# Patient Record
Sex: Male | Born: 1960
Health system: Southern US, Community
[De-identification: ages and names within clinical notes are randomized; demographics above are authoritative.]

## PROBLEM LIST (undated history)

## (undated) DIAGNOSIS — E7800 Pure hypercholesterolemia, unspecified: Secondary | ICD-10-CM

## (undated) HISTORY — DX: Pure hypercholesterolemia, unspecified: E78.00

---

## 2001-06-10 HISTORY — PX: BREAST LUMPECTOMY: SHX2

## 2001-07-14 ENCOUNTER — Encounter: Payer: Self-pay | Admitting: Family Medicine

## 2001-07-14 ENCOUNTER — Ambulatory Visit (HOSPITAL_COMMUNITY): Admission: RE | Admit: 2001-07-14 | Discharge: 2001-07-14 | Payer: Self-pay | Admitting: Family Medicine

## 2005-08-15 ENCOUNTER — Encounter: Admission: RE | Admit: 2005-08-15 | Discharge: 2005-08-15 | Payer: Self-pay | Admitting: Occupational Medicine

## 2009-05-17 ENCOUNTER — Encounter: Payer: Self-pay | Admitting: Internal Medicine

## 2009-05-22 ENCOUNTER — Ambulatory Visit: Payer: Self-pay | Admitting: Internal Medicine

## 2009-05-22 ENCOUNTER — Ambulatory Visit (HOSPITAL_COMMUNITY): Admission: RE | Admit: 2009-05-22 | Discharge: 2009-05-22 | Payer: Self-pay | Admitting: Internal Medicine

## 2011-03-27 ENCOUNTER — Ambulatory Visit (HOSPITAL_COMMUNITY)
Admission: RE | Admit: 2011-03-27 | Discharge: 2011-03-27 | Disposition: A | Discharge status: Home / Self Care | Payer: BC Managed Care – PPO | Source: Ambulatory Visit | Attending: Physician Assistant | Admitting: Physician Assistant

## 2011-03-27 ENCOUNTER — Other Ambulatory Visit (HOSPITAL_COMMUNITY): Payer: Self-pay | Admitting: Physician Assistant

## 2011-03-27 DIAGNOSIS — M542 Cervicalgia: Secondary | ICD-10-CM

## 2011-03-27 DIAGNOSIS — M5412 Radiculopathy, cervical region: Secondary | ICD-10-CM

## 2011-03-27 DIAGNOSIS — M25519 Pain in unspecified shoulder: Secondary | ICD-10-CM | POA: Insufficient documentation

## 2011-04-02 ENCOUNTER — Other Ambulatory Visit (HOSPITAL_COMMUNITY): Payer: Self-pay | Admitting: Physician Assistant

## 2011-04-02 DIAGNOSIS — M5412 Radiculopathy, cervical region: Secondary | ICD-10-CM

## 2011-04-02 DIAGNOSIS — M542 Cervicalgia: Secondary | ICD-10-CM

## 2011-04-04 ENCOUNTER — Other Ambulatory Visit (HOSPITAL_COMMUNITY): Payer: BC Managed Care – PPO

## 2011-04-08 ENCOUNTER — Ambulatory Visit (HOSPITAL_COMMUNITY)
Admission: RE | Admit: 2011-04-08 | Discharge: 2011-04-08 | Disposition: A | Discharge status: Home / Self Care | Payer: BC Managed Care – PPO | Source: Ambulatory Visit | Attending: Physician Assistant | Admitting: Physician Assistant

## 2011-04-08 ENCOUNTER — Other Ambulatory Visit (HOSPITAL_COMMUNITY): Payer: BC Managed Care – PPO

## 2011-04-08 DIAGNOSIS — M542 Cervicalgia: Secondary | ICD-10-CM

## 2011-04-08 DIAGNOSIS — M5412 Radiculopathy, cervical region: Secondary | ICD-10-CM | POA: Insufficient documentation

## 2011-04-08 DIAGNOSIS — M47812 Spondylosis without myelopathy or radiculopathy, cervical region: Secondary | ICD-10-CM | POA: Insufficient documentation

## 2011-04-08 DIAGNOSIS — R209 Unspecified disturbances of skin sensation: Secondary | ICD-10-CM | POA: Insufficient documentation

## 2012-05-01 ENCOUNTER — Other Ambulatory Visit (HOSPITAL_COMMUNITY): Payer: Self-pay | Admitting: Internal Medicine

## 2012-05-01 DIAGNOSIS — M5430 Sciatica, unspecified side: Secondary | ICD-10-CM

## 2012-05-01 DIAGNOSIS — M545 Low back pain, unspecified: Secondary | ICD-10-CM

## 2012-05-04 ENCOUNTER — Ambulatory Visit (HOSPITAL_COMMUNITY)
Admission: RE | Admit: 2012-05-04 | Discharge: 2012-05-04 | Disposition: A | Discharge status: Home / Self Care | Payer: BC Managed Care – PPO | Source: Ambulatory Visit | Attending: Internal Medicine | Admitting: Internal Medicine

## 2012-05-04 DIAGNOSIS — M545 Low back pain, unspecified: Secondary | ICD-10-CM | POA: Insufficient documentation

## 2012-05-04 DIAGNOSIS — M47817 Spondylosis without myelopathy or radiculopathy, lumbosacral region: Secondary | ICD-10-CM | POA: Insufficient documentation

## 2012-05-04 DIAGNOSIS — M5430 Sciatica, unspecified side: Secondary | ICD-10-CM | POA: Insufficient documentation

## 2013-03-03 ENCOUNTER — Other Ambulatory Visit (HOSPITAL_COMMUNITY): Payer: Self-pay | Admitting: Family Medicine

## 2013-03-03 DIAGNOSIS — Q618 Other cystic kidney diseases: Secondary | ICD-10-CM

## 2013-03-08 ENCOUNTER — Ambulatory Visit (HOSPITAL_COMMUNITY)
Admission: RE | Admit: 2013-03-08 | Discharge: 2013-03-08 | Disposition: A | Discharge status: Home / Self Care | Payer: BC Managed Care – PPO | Source: Ambulatory Visit | Attending: Family Medicine | Admitting: Family Medicine

## 2013-03-08 DIAGNOSIS — Q619 Cystic kidney disease, unspecified: Secondary | ICD-10-CM | POA: Insufficient documentation

## 2013-03-08 DIAGNOSIS — Q618 Other cystic kidney diseases: Secondary | ICD-10-CM

## 2014-09-07 ENCOUNTER — Ambulatory Visit (INDEPENDENT_AMBULATORY_CARE_PROVIDER_SITE_OTHER): Payer: BLUE CROSS/BLUE SHIELD | Admitting: Neurology

## 2014-09-07 ENCOUNTER — Encounter: Payer: Self-pay | Admitting: Neurology

## 2014-09-07 VITALS — BP 122/80 | HR 69 | Resp 16 | Ht 68.0 in | Wt 217.8 lb

## 2014-09-07 DIAGNOSIS — G4733 Obstructive sleep apnea (adult) (pediatric): Secondary | ICD-10-CM | POA: Insufficient documentation

## 2014-09-07 DIAGNOSIS — G4726 Circadian rhythm sleep disorder, shift work type: Secondary | ICD-10-CM | POA: Diagnosis not present

## 2014-09-07 DIAGNOSIS — Z9889 Other specified postprocedural states: Secondary | ICD-10-CM | POA: Diagnosis not present

## 2014-09-07 DIAGNOSIS — M2619 Other specified anomalies of jaw-cranial base relationship: Secondary | ICD-10-CM | POA: Diagnosis not present

## 2014-09-07 DIAGNOSIS — Z9989 Dependence on other enabling machines and devices: Secondary | ICD-10-CM | POA: Insufficient documentation

## 2014-09-07 NOTE — Progress Notes (Signed)
SLEEP MEDICINE CLINIC   Provider:  Melvyn Novasarmen  Erikson Danzy, M D  Referring Provider: Assunta FoundGolding, John, MD Primary Care Physician:  Colette RibasGOLDING, JOHN CABOT, MD  Chief Complaint  Patient presents with  . NP [facility] Sleep Consult/ using CPAP    Rm 10, alone    HPI:  Joshua Kirby is a [age] y.o. male , seen here as a referral  from Dr. Theodis ShoveJunell Koberlein,  Mr. [name], a 54 year old right-handed gentleman is seen here today for a sleep consultation. He is an established CPAP user and has [facility] [place] he had 2 sleep studies in the past 1 diagnostic  In 2002 , leading to an ENT procedure with tonsillectomy and UPPP, and this he;ped for less than one year, after that another PSG with CPAP titration in 2004 , initially with a FFM , later with a nasal pillow. He felt better rested and had more energy, it worked well for him.  He got another, newer machine in 2009.  Mr. [name] works for [employer] and does wear respiration equipment and respiration protective equipment. He did not have trouble tolerating a full face mask due to any claustrophobia, his problem were pressure marks of the pressure on the lower jaw. He is a compliant CPAP user and he brought his machine to today's visit. It is a Respimat Elite 2 machine with EPR settings and with her manual dye all for the humidifier. It looks to be in good shape. The machine is set at 13 cm water pressure his average daily usage time is 8 hours and 23 minutes and over the last 90 days he has missed only 1. His compliance is 96% for over 4 hours of use. He is highly compliant. His residual AHI is 3.8, which is fine. The air leak at the 95th percentile is 0.0 liters.  He has variable work hours and sometimes starts at 4:30 in the morning on other days 8 AM. 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 The variability of his work day he also  extends to the hours worked in total. On occasion he will work up to 14 hours straight. So he mainly varies between an early and late shift but usually not strictly night shift work. He feels refreshed and restored when he wakes up in the morning and at this time does not struggle with excessive daytime sleepiness. He does nap , sometimes without CPAP, and his wife noted snoring . Usually he naps for only 15-  45 minutes. He reports no dream activity during naps.  He drinks one cup of coffee in early AM  12 ounces, and during daytime up to 2  Caffeine containing  Sodas.   CPAP leaks water, The water reservoirs heating plate -seal is defect.  He uses a nasal pillow and would like it to be attached to a heated coil to avoid condensation water.    He needs new supplies, not a new machine yet.   Review of Systems: Out of a complete 14 system review, the patient complains of only the following symptoms, and all other reviewed systems are negative.   Epworth score  6 , Fatigue severity score 13  , depression score 1 .    History   Social History  . Marital Status: Married    Spouse Name: [name]  . Number of Children: 2  . Years of  Education: HS   Occupational History  .      [employer]   Social History Main Topics  . Smoking status: Never Smoker   . Smokeless tobacco: Not on file  . Alcohol Use: No  . Drug Use: No  . Sexual Activity: Not on file   Other Topics Concern  . Not on file   Social History Narrative   Caffeine 1 cup daily am, 2 drinks daily.    Family History  Problem Relation Age of Onset  . Diabetes Brother   . Kidney disease Mother     Past Medical History  Diagnosis Date  . High cholesterol     Past Surgical History  Procedure Laterality Date  . Breast lumpectomy Right 2003    Benign    Current Outpatient Prescriptions  Medication Sig Dispense Refill  . naproxen sodium (ANAPROX) 220 MG tablet Take 220 mg by mouth daily. May take additional one if  needed.    . simvastatin (ZOCOR) 20 MG tablet Take 20 mg by mouth daily.     No current facility-administered medications for this visit.    Allergies as of 09/07/2014  . (No Known Allergies)    Vitals: BP 122/80 mmHg  Pulse 69  Resp 16  Ht  (1.727 m)  Wt 217 lb 12.8 oz (98.793 kg)  BMI 33.12 kg/m2 Last Weight:  Wt Readings from Last 1 Encounters:  09/07/14 217 lb 12.8 oz (98.793 kg)       Last Height:   Ht Readings from Last 1 Encounters:  09/07/14  (1.727 m)    Physical exam:  General: The patient is awake, alert and appears not in acute distress. The patient is well groomed. Head: Normocephalic, atraumatic. Neck is supple. Mallampati 3 , status post UPPP.  neck circumference: 18 . Nasal airflow  Rhinitis is allergy related, seasonal., TMJ is not evident . Retrognathia is seen. The patient has a mustache.  Cardiovascular:  Regular rate and rhythm , without  murmurs or carotid bruit, and without distended neck veins. Respiratory: Lungs are clear to auscultation. Skin:  Without evidence of edema, or rash Trunk: BMI is  elevated and patient  has normal posture.  Neurologic exam : The patient is awake and alert, oriented to place and time.   Memory subjective described as intact. There is a normal attention span & concentration ability.  Speech is fluent without  dysarthria, dysphonia or aphasia. Mood and affect are appropriate.  Cranial nerves: Pupils are equal and briskly reactive to light. Funduscopic exam without  evidence of pallor or edema.  Extraocular movements  in vertical and horizontal planes intact and without nystagmus. Visual fields by finger perimetry are intact. Hearing to finger rub intact.  Facial sensation intact to fine touch. Facial motor strength is symmetric and tongue and uvula move midline.  Motor exam:  Tone ,muscle bulk and strength are symmetric  in all extremities.  Sensory:  Fine touch, pinprick and vibration were tested in all  extremities.  He has numbness in the left index finger, cervical 6.  Proprioception is  normal.  Coordination: Rapid alternating movements in the fingers/hands is normal.  Finger-to-nose maneuver  normal without evidence of ataxia, dysmetria or tremor.  Gait and station: Patient walks without assistive device and is able unassisted to climb up to the exam table. Strength within normal limits. Stance is stable and normal. Tandem gait is unfragmented. Romberg testing is   negative.  Deep tendon reflexes: in the  upper and lower extremities are symmetric and intact. Babinski maneuver response is  downgoing.   Assessment:  After physical and neurologic examination, review of laboratory studies, imaging, neurophysiology testing and pre-existing records, assessment is  1) established CPAP user, original sleep study not available. He is feeling and doing well on CPAP currently set at 13 cm without EPR. Needs supplies, but not a new study.  50% face to face time was used in discussion of new treatment options, the risk factors arising form untreated OSA and the new  Interfaces , higher comfort in CPAP use.    The patient was advised of the nature of the diagnosed sleep disorder , the treatment options and risks for general a health and wellness arising from not treating the condition. Visit duration was 30 minutes.   Plan:  Treatment plan and additional workup :  The patient requires a new humidifier chamber for his ResMed Elite 2 machine, he needs a new chin strap, I would like for him to try a [employer] Respironics dream where mask of which I showed him a picture. I will ask [facility] in [place] to see if the patient could be fitted. A heated coil would be beneficial.      Melvyn Novas MD  09/07/2014

## 2014-09-30 ENCOUNTER — Encounter: Payer: Self-pay | Admitting: Neurology

## 2014-11-11 ENCOUNTER — Emergency Department (HOSPITAL_COMMUNITY)
Admission: EM | Admit: 2014-11-11 | Discharge: 2014-11-11 | Disposition: A | Discharge status: Home / Self Care | Payer: BLUE CROSS/BLUE SHIELD | Attending: Emergency Medicine | Admitting: Emergency Medicine

## 2014-11-11 ENCOUNTER — Emergency Department (HOSPITAL_COMMUNITY): Payer: BLUE CROSS/BLUE SHIELD

## 2014-11-11 ENCOUNTER — Encounter (HOSPITAL_COMMUNITY): Payer: Self-pay | Admitting: Emergency Medicine

## 2014-11-11 DIAGNOSIS — Z79899 Other long term (current) drug therapy: Secondary | ICD-10-CM | POA: Diagnosis not present

## 2014-11-11 DIAGNOSIS — Z8669 Personal history of other diseases of the nervous system and sense organs: Secondary | ICD-10-CM | POA: Insufficient documentation

## 2014-11-11 DIAGNOSIS — M542 Cervicalgia: Secondary | ICD-10-CM | POA: Diagnosis not present

## 2014-11-11 DIAGNOSIS — R079 Chest pain, unspecified: Secondary | ICD-10-CM

## 2014-11-11 DIAGNOSIS — E780 Pure hypercholesterolemia: Secondary | ICD-10-CM | POA: Insufficient documentation

## 2014-11-11 DIAGNOSIS — Z791 Long term (current) use of non-steroidal anti-inflammatories (NSAID): Secondary | ICD-10-CM | POA: Insufficient documentation

## 2014-11-11 LAB — CBC WITH DIFFERENTIAL/PLATELET
Basophils Absolute: 0 10*3/uL (ref 0.0–0.1)
Basophils Relative: 1 % (ref 0–1)
Eosinophils Absolute: 0.2 10*3/uL (ref 0.0–0.7)
Eosinophils Relative: 2 % (ref 0–5)
HCT: 44 % (ref 39.0–52.0)
HEMOGLOBIN: 14.7 g/dL (ref 13.0–17.0)
LYMPHS ABS: 2.2 10*3/uL (ref 0.7–4.0)
Lymphocytes Relative: 26 % (ref 12–46)
MCH: 30.2 pg (ref 26.0–34.0)
MCHC: 33.4 g/dL (ref 30.0–36.0)
MCV: 90.3 fL (ref 78.0–100.0)
MONOS PCT: 9 % (ref 3–12)
Monocytes Absolute: 0.8 10*3/uL (ref 0.1–1.0)
Neutro Abs: 5.3 10*3/uL (ref 1.7–7.7)
Neutrophils Relative %: 62 % (ref 43–77)
Platelets: 190 10*3/uL (ref 150–400)
RBC: 4.87 MIL/uL (ref 4.22–5.81)
RDW: 12.5 % (ref 11.5–15.5)
WBC: 8.5 10*3/uL (ref 4.0–10.5)

## 2014-11-11 LAB — BASIC METABOLIC PANEL
Anion gap: 9 (ref 5–15)
BUN: 12 mg/dL (ref 6–20)
CALCIUM: 8.9 mg/dL (ref 8.9–10.3)
CO2: 27 mmol/L (ref 22–32)
CREATININE: 0.97 mg/dL (ref 0.61–1.24)
Chloride: 105 mmol/L (ref 101–111)
GFR calc Af Amer: >60 mL/min (ref >60)
GFR calc non Af Amer: >60 mL/min (ref >60)
GLUCOSE: 142 mg/dL — AB (ref 65–99)
POTASSIUM: 3.4 mmol/L — AB (ref 3.5–5.1)
SODIUM: 141 mmol/L (ref 135–145)

## 2014-11-11 LAB — TROPONIN I: Troponin I: <0.03 ng/mL (ref <0.031)

## 2014-11-11 MED ORDER — NAPROXEN 375 MG PO TABS: 375.0000 mg | ORAL_TABLET | Freq: Two times a day (BID) | ORAL | Status: DC

## 2014-11-11 MED ORDER — ORPHENADRINE CITRATE ER 100 MG PO TB12
100.0000 mg | ORAL_TABLET | Freq: Two times a day (BID) | ORAL | Status: DC | PRN
Start: 2014-11-11 — End: 2017-07-15

## 2014-11-11 MED ORDER — HYDROCODONE-ACETAMINOPHEN 5-325 MG PO TABS
1.0000 | ORAL_TABLET | ORAL | Status: DC | PRN
Start: 2014-11-11 — End: 2017-07-15

## 2014-11-11 MED ORDER — HYDROMORPHONE HCL 1 MG/ML IJ SOLN
1.0000 mg | Freq: Once | INTRAMUSCULAR | Status: AC
  Administered 2014-11-11: 1 mg via INTRAVENOUS
  Filled 2014-11-11: qty 1

## 2014-11-11 NOTE — ED Notes (Signed)
Patient reports right-sided chest pain that started two weeks ago, worsening today. Reports radiation to right shoulder, right neck, and right arm. Also reports numbness and tingling to right ring and pinky finger and to right thigh.

## 2014-11-11 NOTE — ED Notes (Signed)
Patient given discharge instruction, verbalized understand. IV removed, band aid applied. Patient ambulatory out of the department.  

## 2014-11-11 NOTE — Discharge Instructions (Signed)
Follow-up closely with her primary doctor to discuss MRI of your cervical region. Take your muscle relaxant and over-the-counter pain meds as needed. For severe pain take norco or vicodin however realize they have the potential for addiction and it can make you sleepy and has tylenol in it.  No operating machinery while taking. If you were given medicines take as directed.  If you are on coumadin or contraceptives realize their levels and effectiveness is altered by many different medicines.  If you have any reaction (rash, tongues swelling, other) to the medicines stop taking and see a physician.    If your blood pressure was elevated in the ER make sure you follow up for management with a primary doctor or return for chest pain, shortness of breath or stroke symptoms.  Please follow up as directed and return to the ER or see a physician for new or worsening symptoms.  Thank you. Filed Vitals:   11/11/14 2009 11/11/14 2030 11/11/14 2100 11/11/14 2130  BP: 152/95 147/96 148/94 141/85  Pulse: 72 77 67 70  Temp:      TempSrc:      Resp: 22 21 20 23   Height:      Weight:      SpO2: 96% 93% 94% 95%

## 2014-11-11 NOTE — ED Provider Notes (Signed)
CSN: [id number]     Arrival date & time 11/11/14  1912 History   First MD Initiated Contact with Patient 11/11/14 1921     Chief Complaint  Patient presents with  . Chest Pain     (Consider location/radiation/quality/duration/timing/severity/associated sxs/prior Treatment) HPI Comments: 54 year old male with history of sleep apnea, cholesterol, nonsmoker presents with right-sided chest pain and neck pain with mild radiation down right arm. Patient has had this constant for 3 weeks worse with movement in specific positions of his neck. No recent injuries. No recent MRI of the cervical spine. No weakness, mild intermittent numbness to ring finger and small finger on the right. No bowel or bladder incontinence. No cardiac history. No exertional symptoms only worse with movement. Patient saw primary doctor and was given sterilely it's with no improvement. No blood clot history, no recent surgery. No leg swelling no leg weakness.  Patient is a 54 y.o. male presenting with chest pain. The history is provided by the patient.  Chest Pain Associated symptoms: no abdominal pain, no back pain, no fever, no headache, no shortness of breath and not vomiting     Past Medical History  Diagnosis Date  . High cholesterol    Past Surgical History  Procedure Laterality Date  . Breast lumpectomy Right 2003    Benign   Family History  Problem Relation Age of Onset  . Diabetes Brother   . Kidney disease Mother    History  Substance Use Topics  . Smoking status: Never Smoker   . Smokeless tobacco: Not on file  . Alcohol Use: No    Review of Systems  Constitutional: Negative for fever and chills.  HENT: Negative for congestion.   Eyes: Negative for visual disturbance.  Respiratory: Negative for shortness of breath.   Cardiovascular: Positive for chest pain.  Gastrointestinal: Negative for vomiting and abdominal pain.  Genitourinary: Negative for dysuria and flank pain.  Musculoskeletal: Positive  for neck pain. Negative for back pain and neck stiffness.  Skin: Negative for rash.  Neurological: Negative for light-headedness and headaches.      Allergies  Review of patient's allergies indicates no known allergies.  Home Medications   Prior to Admission medications   Medication Sig Start Date End Date Taking? Authorizing Provider  cyclobenzaprine (FLEXERIL) 10 MG tablet Take 10 mg by mouth at bedtime as needed. 10/31/14  Yes Historical Provider, MD  Multiple Vitamin (MULTIVITAMIN WITH MINERALS) TABS tablet Take 1 tablet by mouth daily.   Yes Historical Provider, MD  naproxen sodium (ANAPROX) 220 MG tablet Take 220 mg by mouth daily. May take additional one if needed.   Yes Historical Provider, MD  simvastatin (ZOCOR) 20 MG tablet Take 20 mg by mouth every evening.    Yes Historical Provider, MD  HYDROcodone-acetaminophen (NORCO) 5-325 MG per tablet Take 1-2 tablets by mouth every 4 (four) hours as needed. 11/11/14   Blane Ohara, MD  naproxen (NAPROSYN) 375 MG tablet Take 1 tablet (375 mg total) by mouth 2 (two) times daily. 11/11/14   Blane Ohara, MD  orphenadrine (NORFLEX) 100 MG tablet Take 1 tablet (100 mg total) by mouth 2 (two) times daily as needed for muscle spasms. 11/11/14   Blane Ohara, MD  predniSONE (STERAPRED UNI-PAK 21 TAB) 5 MG (21) TBPK tablet  10/31/14   Historical Provider, MD   BP 141/85 mmHg  Pulse 70  Temp(Src) 98.2 F (36.8 C) (Oral)  Resp 23  Ht  (1.702 m)  Wt 215 lb (97.523 kg)  BMI 33.67 kg/m2  SpO2 95% Physical Exam  Constitutional: He is oriented to person, place, and time. He appears well-developed and well-nourished.  HENT:  Head: Normocephalic and atraumatic.  Eyes: Conjunctivae are normal. Right eye exhibits no discharge. Left eye exhibits no discharge.  Neck: Normal range of motion. Neck supple. No tracheal deviation present.  Cardiovascular: Normal rate, regular rhythm and intact distal pulses.   Pulmonary/Chest: Effort normal and breath  sounds normal.  Abdominal: Soft. He exhibits no distension. There is no tenderness. There is no guarding.  Musculoskeletal: He exhibits tenderness. He exhibits no edema.  Patient has 5+ strength with shoulder abduction, arm flexion, finger abduction, grip strength and wrist extension bilateral upper extremities. Sensation intact and major nerve distributions bilateral. Patient has tenderness with shoulder flexion and palpation of right trapezius  Neurological: He is alert and oriented to person, place, and time. GCS eye subscore is 4. GCS verbal subscore is 5. GCS motor subscore is 6.  Patient has 5+ strength upper extremities with shoulder abduction, arm flexion, wrist extension, finger flexion, finger extension. Patient has equal sensation to palpation in major nerves in the upper extremities.  Skin: Skin is warm. No rash noted.  Psychiatric: He has a normal mood and affect.  Nursing note and vitals reviewed.   ED Course  Procedures (including critical care time) Labs Review Labs Reviewed  BASIC METABOLIC PANEL - Abnormal; Notable for the following:    Potassium 3.4 (*)    Glucose, Bld 142 (*)    All other components within normal limits  CBC WITH DIFFERENTIAL/PLATELET  TROPONIN I    Imaging Review Dg Chest 2 View  11/11/2014   CLINICAL DATA:  Right-sided chest pain radiating to shoulder. Worsening today. Initial encounter.  EXAM: CHEST  2 VIEW  COMPARISON:  08/2005  FINDINGS: Normal mediastinum and cardiac silhouette. Normal pulmonary vasculature. No evidence of effusion, infiltrate, or pneumothorax. No acute bony abnormality.  IMPRESSION: No acute cardiopulmonary process.   Electronically Signed   By: Genevive BiStewart  Edmunds M.D.   On: 11/11/2014 20:08     EKG Interpretation None     EKG reviewed heart rate 86, no acute ST changes, T-wave inversion aVL, normal QT.  MDM   Final diagnoses:  Neck pain  Right-sided chest pain   Patient presents with clinical concern for  radicular/musculoskeletal pain as the cause of his chest pain. Patient is low risk cardiac, plan for cardiac screen.Pain reproducible/ positional.  Discussed pain meds and follow-up with primary Dr. for outpatient MRI of the cervical spine. Patient has had constant symptoms for 3 weeks.. Cardiac screen unremarkable. Follow-up with primary doctor. Pain medicines given.  Results and differential diagnosis were discussed with the patient/parent/guardian. Close follow up outpatient was discussed, comfortable with the plan.   Medications  HYDROmorphone (DILAUDID) injection 1 mg (1 mg Intravenous Given 11/11/14 2006)    Filed Vitals:   11/11/14 2009 11/11/14 2030 11/11/14 2100 11/11/14 2130  BP: 152/95 147/96 148/94 141/85  Pulse: 72 77 67 70  Temp:      TempSrc:      Resp: 22 21 20 23   Height:      Weight:      SpO2: 96% 93% 94% 95%    Final diagnoses:  Neck pain  Right-sided chest pain       Blane OharaJoshua Dalasia Predmore, MD 11/11/14 2149

## 2014-11-11 NOTE — ED Notes (Signed)
Pt states pain in right side of chest into shoulder and neck worse with movement, denies pain with deep breath

## 2014-11-16 ENCOUNTER — Other Ambulatory Visit (HOSPITAL_COMMUNITY): Payer: Self-pay | Admitting: Physician Assistant

## 2014-11-16 DIAGNOSIS — M5412 Radiculopathy, cervical region: Secondary | ICD-10-CM

## 2014-11-16 DIAGNOSIS — M5020 Other cervical disc displacement, unspecified cervical region: Secondary | ICD-10-CM

## 2014-11-18 ENCOUNTER — Ambulatory Visit (HOSPITAL_COMMUNITY)
Admission: RE | Admit: 2014-11-18 | Discharge: 2014-11-18 | Disposition: A | Discharge status: Home / Self Care | Payer: BLUE CROSS/BLUE SHIELD | Source: Ambulatory Visit | Attending: Physician Assistant | Admitting: Physician Assistant

## 2014-11-18 DIAGNOSIS — M5412 Radiculopathy, cervical region: Secondary | ICD-10-CM | POA: Diagnosis present

## 2014-11-18 DIAGNOSIS — M5020 Other cervical disc displacement, unspecified cervical region: Secondary | ICD-10-CM

## 2014-11-29 ENCOUNTER — Ambulatory Visit (HOSPITAL_COMMUNITY): Payer: BLUE CROSS/BLUE SHIELD

## 2015-08-28 ENCOUNTER — Ambulatory Visit: Payer: BLUE CROSS/BLUE SHIELD | Admitting: Neurology

## 2016-01-24 DIAGNOSIS — Z0001 Encounter for general adult medical examination with abnormal findings: Secondary | ICD-10-CM | POA: Diagnosis not present

## 2016-01-24 DIAGNOSIS — Z125 Encounter for screening for malignant neoplasm of prostate: Secondary | ICD-10-CM | POA: Diagnosis not present

## 2016-01-24 DIAGNOSIS — E782 Mixed hyperlipidemia: Secondary | ICD-10-CM | POA: Diagnosis not present

## 2016-01-24 DIAGNOSIS — Z6833 Body mass index (BMI) 33.0-33.9, adult: Secondary | ICD-10-CM | POA: Diagnosis not present

## 2016-01-24 DIAGNOSIS — Z1389 Encounter for screening for other disorder: Secondary | ICD-10-CM | POA: Diagnosis not present

## 2017-02-04 DIAGNOSIS — E6609 Other obesity due to excess calories: Secondary | ICD-10-CM | POA: Diagnosis not present

## 2017-02-04 DIAGNOSIS — Z1389 Encounter for screening for other disorder: Secondary | ICD-10-CM | POA: Diagnosis not present

## 2017-02-04 DIAGNOSIS — E782 Mixed hyperlipidemia: Secondary | ICD-10-CM | POA: Diagnosis not present

## 2017-02-04 DIAGNOSIS — M722 Plantar fascial fibromatosis: Secondary | ICD-10-CM | POA: Diagnosis not present

## 2017-02-04 DIAGNOSIS — Z6833 Body mass index (BMI) 33.0-33.9, adult: Secondary | ICD-10-CM | POA: Diagnosis not present

## 2017-02-26 DIAGNOSIS — J069 Acute upper respiratory infection, unspecified: Secondary | ICD-10-CM | POA: Diagnosis not present

## 2017-02-26 DIAGNOSIS — J209 Acute bronchitis, unspecified: Secondary | ICD-10-CM | POA: Diagnosis not present

## 2017-02-26 DIAGNOSIS — J329 Chronic sinusitis, unspecified: Secondary | ICD-10-CM | POA: Diagnosis not present

## 2017-04-17 DIAGNOSIS — M79671 Pain in right foot: Secondary | ICD-10-CM | POA: Diagnosis not present

## 2017-04-17 DIAGNOSIS — M722 Plantar fascial fibromatosis: Secondary | ICD-10-CM | POA: Diagnosis not present

## 2017-06-12 DIAGNOSIS — M5410 Radiculopathy, site unspecified: Secondary | ICD-10-CM | POA: Diagnosis not present

## 2017-06-12 DIAGNOSIS — Z681 Body mass index (BMI) 19 or less, adult: Secondary | ICD-10-CM | POA: Diagnosis not present

## 2017-06-12 DIAGNOSIS — S39012A Strain of muscle, fascia and tendon of lower back, initial encounter: Secondary | ICD-10-CM | POA: Diagnosis not present

## 2017-06-12 DIAGNOSIS — Z1389 Encounter for screening for other disorder: Secondary | ICD-10-CM | POA: Diagnosis not present

## 2017-07-15 ENCOUNTER — Encounter (HOSPITAL_COMMUNITY): Payer: Self-pay | Admitting: Emergency Medicine

## 2017-07-15 ENCOUNTER — Emergency Department (HOSPITAL_COMMUNITY)
Admission: EM | Admit: 2017-07-15 | Discharge: 2017-07-15 | Disposition: A | Discharge status: Home / Self Care | Payer: BLUE CROSS/BLUE SHIELD | Attending: Emergency Medicine | Admitting: Emergency Medicine

## 2017-07-15 ENCOUNTER — Emergency Department (HOSPITAL_COMMUNITY): Payer: BLUE CROSS/BLUE SHIELD

## 2017-07-15 DIAGNOSIS — R42 Dizziness and giddiness: Secondary | ICD-10-CM | POA: Diagnosis not present

## 2017-07-15 DIAGNOSIS — R0602 Shortness of breath: Secondary | ICD-10-CM | POA: Insufficient documentation

## 2017-07-15 DIAGNOSIS — R739 Hyperglycemia, unspecified: Secondary | ICD-10-CM | POA: Diagnosis not present

## 2017-07-15 LAB — CBC WITH DIFFERENTIAL/PLATELET
BASOS PCT: 0 %
Basophils Absolute: 0 10*3/uL (ref 0.0–0.1)
Eosinophils Absolute: 0.1 10*3/uL (ref 0.0–0.7)
Eosinophils Relative: 1 %
HEMATOCRIT: 43.6 % (ref 39.0–52.0)
Hemoglobin: 14.2 g/dL (ref 13.0–17.0)
Lymphocytes Relative: 22 %
Lymphs Abs: 1.7 10*3/uL (ref 0.7–4.0)
MCH: 29.6 pg (ref 26.0–34.0)
MCHC: 32.6 g/dL (ref 30.0–36.0)
MCV: 91 fL (ref 78.0–100.0)
MONO ABS: 0.6 10*3/uL (ref 0.1–1.0)
MONOS PCT: 8 %
NEUTROS ABS: 5.3 10*3/uL (ref 1.7–7.7)
Neutrophils Relative %: 69 %
Platelets: 209 10*3/uL (ref 150–400)
RBC: 4.79 MIL/uL (ref 4.22–5.81)
RDW: 12.5 % (ref 11.5–15.5)
WBC: 7.7 10*3/uL (ref 4.0–10.5)

## 2017-07-15 LAB — COMPREHENSIVE METABOLIC PANEL
ALBUMIN: 4.3 g/dL (ref 3.5–5.0)
ALK PHOS: 81 U/L (ref 38–126)
ALT: 34 U/L (ref 17–63)
ANION GAP: 13 (ref 5–15)
AST: 35 U/L (ref 15–41)
BUN: 10 mg/dL (ref 6–20)
CALCIUM: 9.2 mg/dL (ref 8.9–10.3)
CO2: 22 mmol/L (ref 22–32)
CREATININE: 1.07 mg/dL (ref 0.61–1.24)
Chloride: 105 mmol/L (ref 101–111)
GFR calc Af Amer: >60 mL/min (ref >60)
GFR calc non Af Amer: >60 mL/min (ref >60)
GLUCOSE: 179 mg/dL — AB (ref 65–99)
Potassium: 3.6 mmol/L (ref 3.5–5.1)
SODIUM: 140 mmol/L (ref 135–145)
Total Bilirubin: 0.6 mg/dL (ref 0.3–1.2)
Total Protein: 7.3 g/dL (ref 6.5–8.1)

## 2017-07-15 LAB — TROPONIN I
Troponin I: <0.03 ng/mL (ref <0.03)
Troponin I: <0.03 ng/mL (ref <0.03)

## 2017-07-15 MED ORDER — IOPAMIDOL (ISOVUE-370) INJECTION 76%
100.0000 mL | Freq: Once | INTRAVENOUS | Status: AC | PRN
  Administered 2017-07-15: 100 mL via INTRAVENOUS

## 2017-07-15 NOTE — ED Triage Notes (Signed)
Pt reports sudden onset shortness of breath and dizziness with eyes closed 1 hour pta.

## 2017-07-15 NOTE — ED Provider Notes (Signed)
[facility] EMERGENCY DEPARTMENT Provider Note   CSN: [id number] Arrival date & time: 07/15/17  1706     History   Chief Complaint Chief Complaint  Patient presents with  . Shortness of Breath    HPI Joshua Kirby is a 57 y.o. male.  Pt presents to the ED today with sob.  The pt said sx started suddenly about 1 hour pta.  Pt also has some dizziness.  He denies cp.  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      Past Medical History:  Diagnosis Date  . High cholesterol     Patient Active Problem List   Diagnosis Date Noted  . Episodic circadian rhythm sleep disorder, shift work type 09/07/2014  . OSA on CPAP 09/07/2014  . Retrognathia 09/07/2014  . S/P UPPP (uvulopalatopharyngoplasty) 09/07/2014    Past Surgical History:  Procedure Laterality Date  . BREAST LUMPECTOMY Right 2003   Benign       Home Medications    Prior to Admission medications   Medication Sig Start Date End Date Taking? Authorizing Provider  simvastatin (ZOCOR) 20 MG tablet Take 20 mg by mouth every evening.    Yes [provider]    Family History Family History  Problem Relation Age of Onset  . Kidney disease Mother   . Diabetes Brother     Social History Social History   Tobacco Use  . Smoking status: Never Smoker  Substance Use Topics  . Alcohol use: No    Alcohol/week: 0.0 oz  . Drug use: No     Allergies   Patient has no known allergies.   Review of Systems Review of Systems  Respiratory: Positive for shortness of breath.   All other systems reviewed and are negative.    Physical Exam Updated Vital Signs BP 128/78   Pulse 73   Temp (!) 97.2 F (36.2 C) (Oral)   Resp (!) 21   Ht 5\' 7"  (1.702 m)   Wt 99.8 kg (220 lb)   SpO2 100%   BMI 34.46 kg/m   Physical Exam  Constitutional: He is oriented to person, place, and time. He appears well-developed and well-nourished.  HENT:  Head: Normocephalic and  atraumatic.  Mouth/Throat: Oropharynx is clear and moist.  Eyes: EOM are normal. Pupils are equal, round, and reactive to light.  Neck: Normal range of motion. Neck supple.  Cardiovascular: Normal rate, regular rhythm, normal heart sounds and intact distal pulses.  Pulmonary/Chest: Effort normal and breath sounds normal.  Abdominal: Soft. Bowel sounds are normal.  Musculoskeletal: Normal range of motion.       Right lower leg: Normal.       Left lower leg: Normal.  Neurological: He is alert and oriented to person, place, and time.  Skin: Skin is warm and dry. Capillary refill takes less than 2 seconds.  Psychiatric: He has a normal mood and affect. His behavior is normal.  Nursing note and vitals reviewed.    ED Treatments / Results  Labs (all labs ordered are listed, but only abnormal results are displayed) Labs Reviewed  COMPREHENSIVE METABOLIC PANEL - Abnormal; Notable for the following components:      Result Value   Glucose, Bld 179 (*)    All other components within normal limits  CBC WITH DIFFERENTIAL/PLATELET  TROPONIN I  TROPONIN I    EKG  EKG Interpretation  Date/Time:  Tuesday July 15 2017 17:32:29 EST  Ventricular Rate:  65 PR Interval:    QRS Duration: 89 QT Interval:  397 QTC Calculation: 413 R Axis:   68 Text Interpretation:  Sinus rhythm Atrial premature complex ST elevation, consider inferior injury No significant change since last tracing Confirmed by Jacalyn LefevreHaviland, Jennalynn Rivard [phone]([id number]) on 07/15/2017 5:39:01 PM       Radiology Ct Angio Chest Pe W Or Wo Contrast  Result Date: 07/15/2017 CLINICAL DATA:  Sudden onset shortness of breath EXAM: CT ANGIOGRAPHY CHEST WITH CONTRAST TECHNIQUE: Multidetector CT imaging of the chest was performed using the standard protocol during bolus administration of intravenous contrast. Multiplanar CT image reconstructions and MIPs were obtained to evaluate the vascular anatomy. CONTRAST:  100mL ISOVUE-370 IOPAMIDOL (ISOVUE-370)  INJECTION 76% COMPARISON:  Radiograph 11/11/2014 FINDINGS: Cardiovascular: Satisfactory opacification of the pulmonary arteries to the segmental level. No evidence of pulmonary embolism. Normal heart size. No pericardial effusion. Nonaneurysmal aorta. No dissection is seen. Mediastinum/Nodes: No enlarged mediastinal, hilar, or axillary lymph nodes. Thyroid gland, trachea, and esophagus demonstrate no significant findings. Lungs/Pleura: Lungs are clear. No pleural effusion or pneumothorax. Upper Abdomen: No acute abnormality. Musculoskeletal: No chest wall abnormality. No acute or significant osseous findings. Review of the MIP images confirms the above findings. IMPRESSION: No CT evidence for acute pulmonary embolus or aortic dissection. No acute abnormality. Electronically Signed   By: Jasmine PangKim  Fujinaga M.D.   On: 07/15/2017 18:51    Procedures Procedures (including critical care time)  Medications Ordered in ED Medications  iopamidol (ISOVUE-370) 76 % injection 100 mL (100 mLs Intravenous Contrast Given 07/15/17 1832)     Initial Impression / Assessment and Plan / ED Course  I have reviewed the triage vital signs and the nursing notes.  Pertinent labs & imaging results that were available during my care of the patient were reviewed by me and considered in my medical decision making (see chart for details).    Pt is feeling much better.  He does not have any sob or cp.  We talked about diet and exercise.  He is to f/u with his pcp to recheck bs.  Return if worse.  Final Clinical Impressions(s) / ED Diagnoses   Final diagnoses:  Hyperglycemia    ED Discharge Orders    None       Jacalyn LefevreHaviland, Damario Gillie, MD 07/15/17 2037

## 2017-07-16 DIAGNOSIS — E119 Type 2 diabetes mellitus without complications: Secondary | ICD-10-CM | POA: Diagnosis not present

## 2017-07-16 DIAGNOSIS — E6609 Other obesity due to excess calories: Secondary | ICD-10-CM | POA: Diagnosis not present

## 2017-07-16 DIAGNOSIS — Z6832 Body mass index (BMI) 32.0-32.9, adult: Secondary | ICD-10-CM | POA: Diagnosis not present

## 2017-07-16 DIAGNOSIS — Z0001 Encounter for general adult medical examination with abnormal findings: Secondary | ICD-10-CM | POA: Diagnosis not present

## 2017-07-16 DIAGNOSIS — Z1389 Encounter for screening for other disorder: Secondary | ICD-10-CM | POA: Diagnosis not present

## 2018-04-04 IMAGING — CT CT ANGIO CHEST
2 of 6 series · 19 of 46 positions shown · IV contrast (Isovue)
Comparison: Radiograph 11/11/2014

CLINICAL DATA: Sudden onset shortness of breath

EXAM:
CT ANGIOGRAPHY CHEST WITH CONTRAST
TECHNIQUE: Multidetector CT imaging of the chest was performed using the
standard protocol during bolus administration of intravenous
contrast. Multiplanar CT image reconstructions and MIPs were
obtained to evaluate the vascular anatomy.
CONTRAST:  100mL KCY7MP-LYM IOPAMIDOL (KCY7MP-LYM) INJECTION 76%

[Series 5: thins · axial · 0.69mm/px · z∈[-118,+152]mm · 16 of 296 slices shown]
[im 13/296  lung]
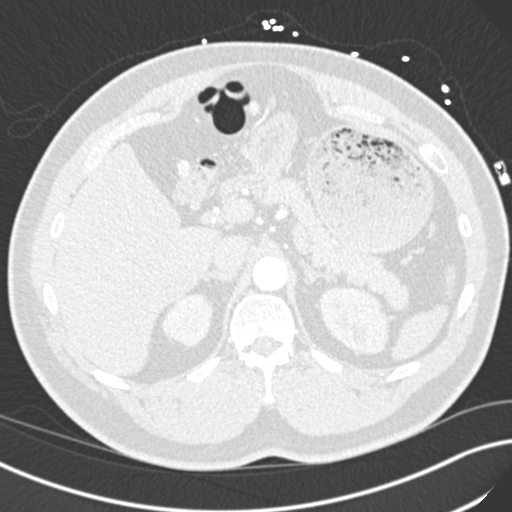
[im 39/296  soft-tissue]
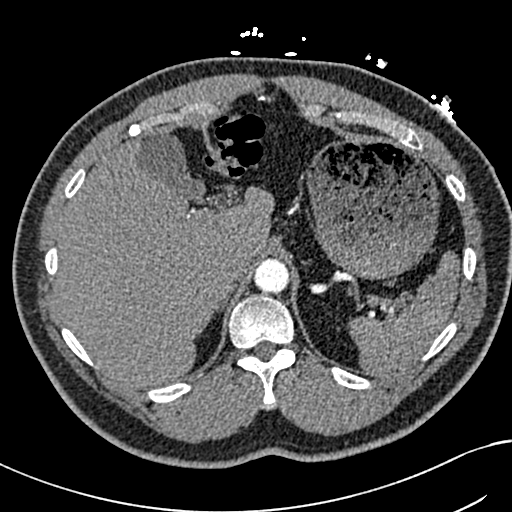
[im 52/296  lung]
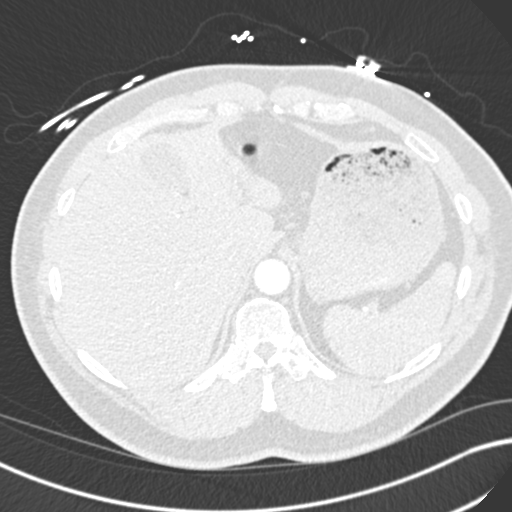
[im 65/296  soft-tissue]
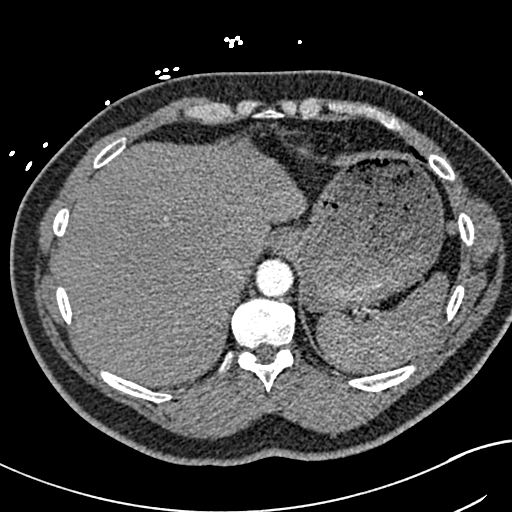
[im 90/296  lung]
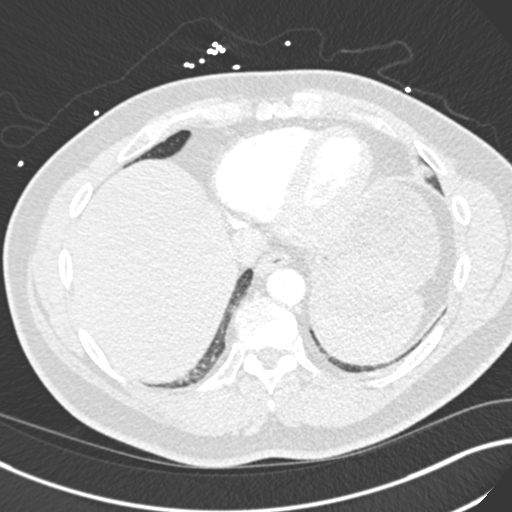
[im 103/296  soft-tissue]
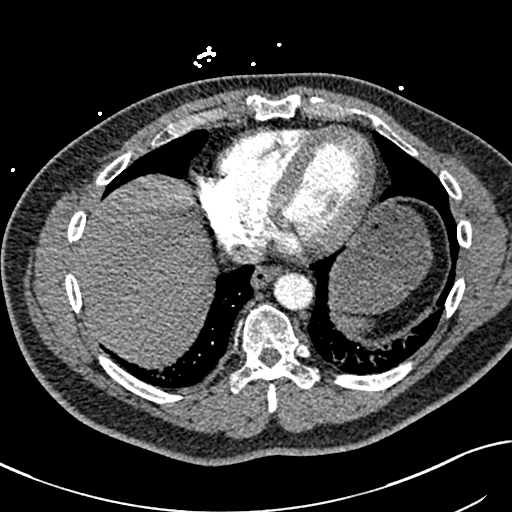
[im 116/296  lung]
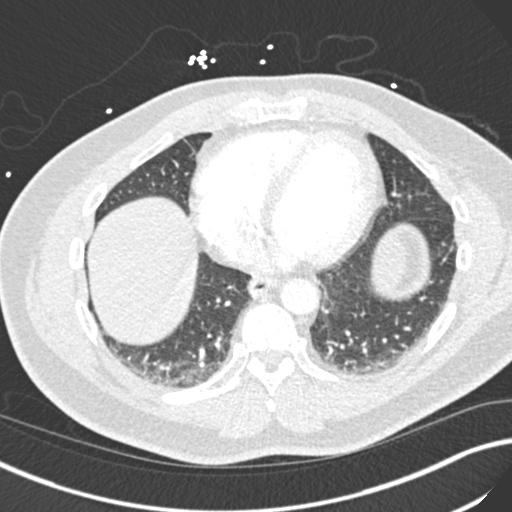
[im 142/296  soft-tissue]
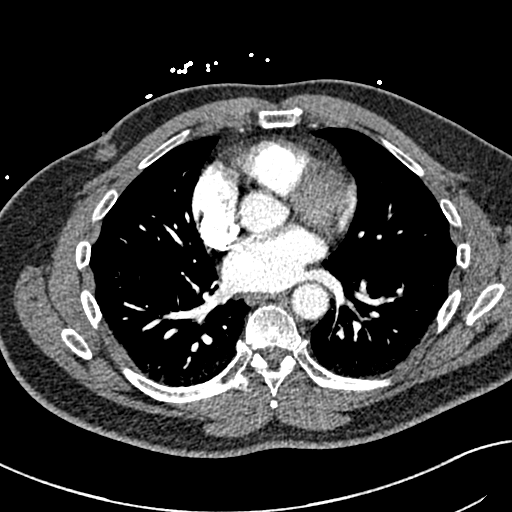
[im 154/296  lung]
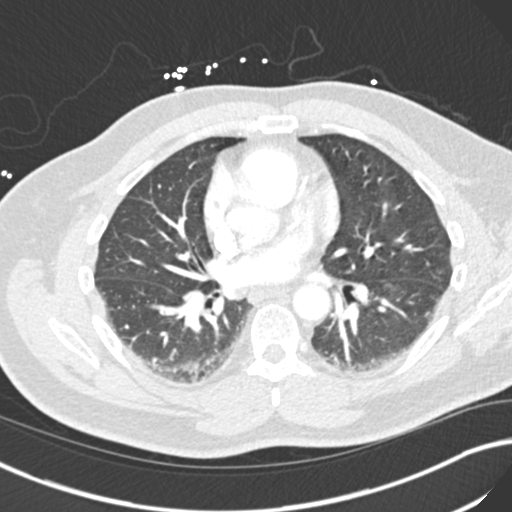
[im 180/296  soft-tissue]
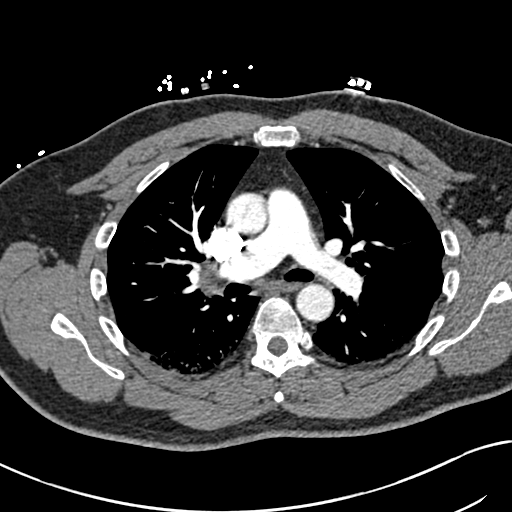
[im 193/296  lung]
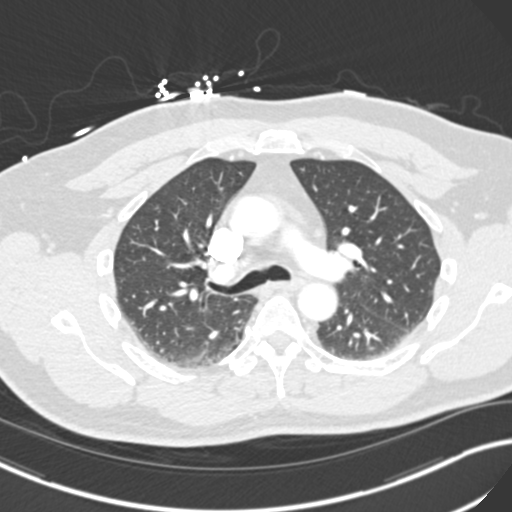
[im 206/296  soft-tissue]
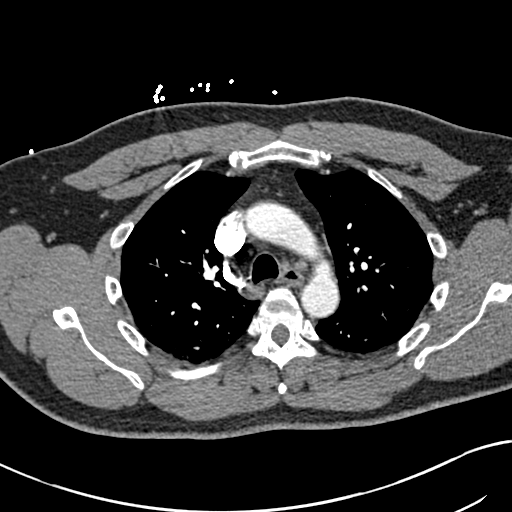
[im 231/296  lung]
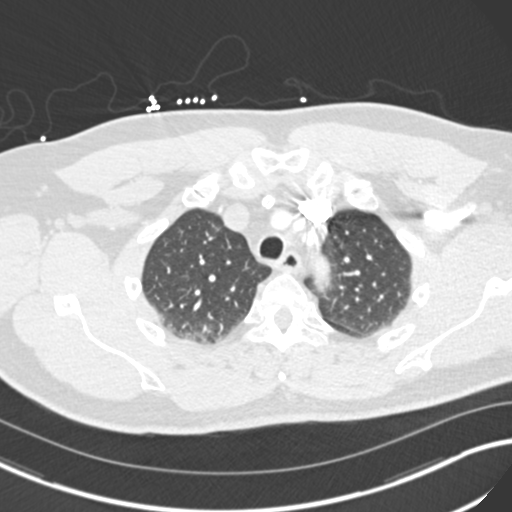
[im 244/296  soft-tissue]
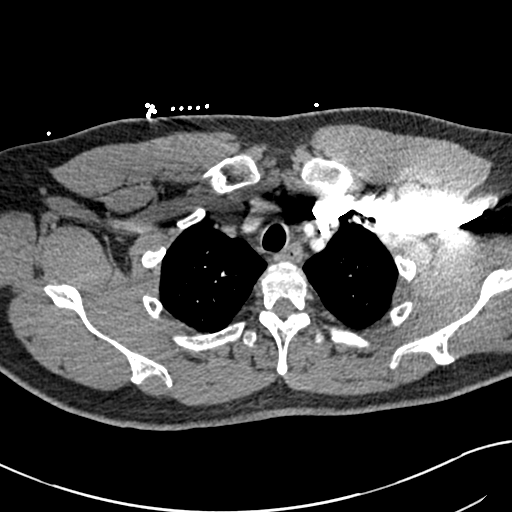
[im 257/296  lung]
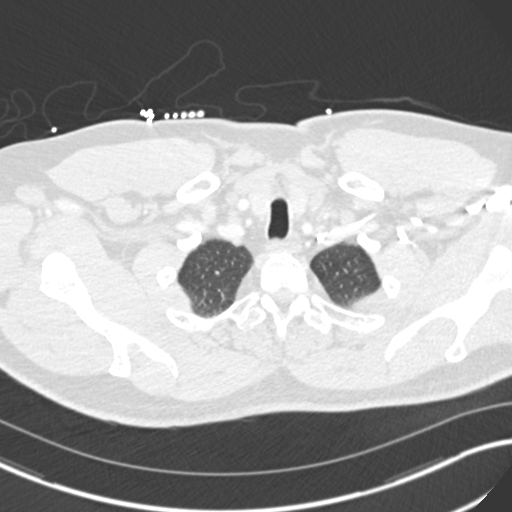
[im 283/296  soft-tissue]
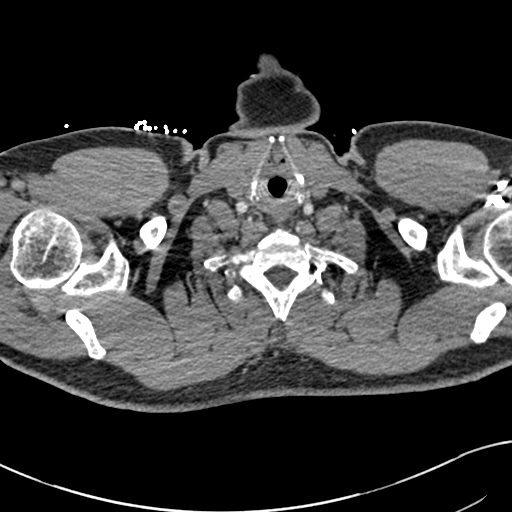

[Series 7: coronal mpr · coronal · 0.64mm/px · 3 of 143 slices shown]
[im 36/143  soft-tissue]
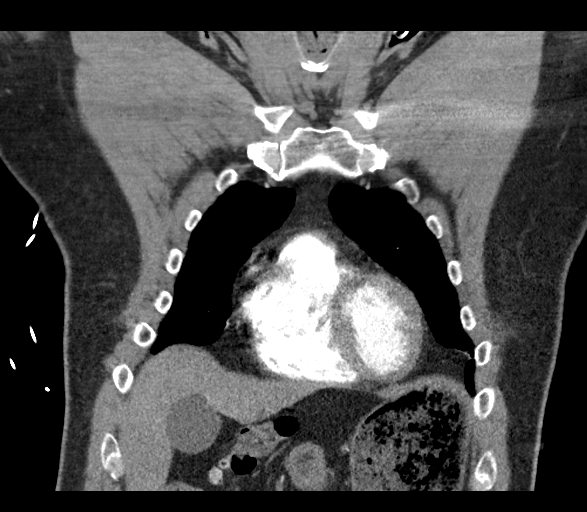
[im 72/143  soft-tissue]
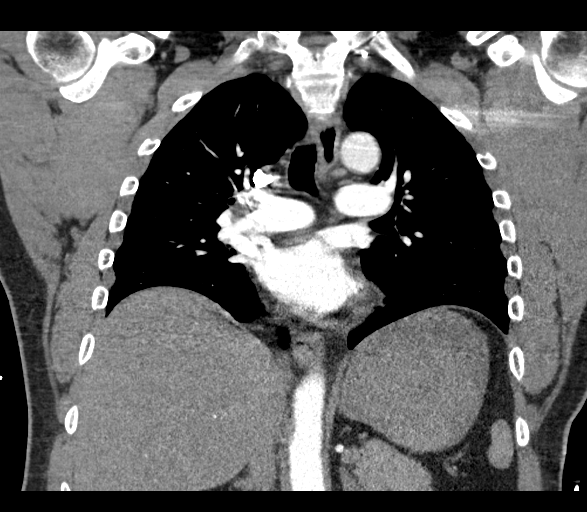
[im 107/143  soft-tissue]
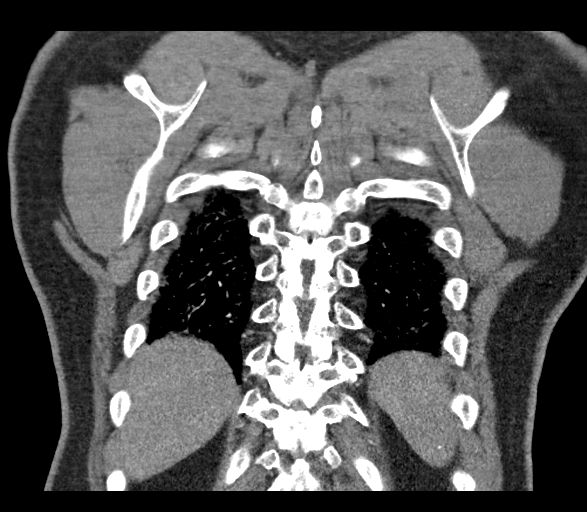

[19 of 46 positions shown; findings below may reference images not displayed]

FINDINGS: Cardiovascular: Satisfactory opacification of the pulmonary arteries
to the segmental level. No evidence of pulmonary embolism. Normal
heart size. No pericardial effusion. Nonaneurysmal aorta. No
dissection is seen.

Mediastinum/Nodes: No enlarged mediastinal, hilar, or axillary lymph
nodes. Thyroid gland, trachea, and esophagus demonstrate no
significant findings.

Lungs/Pleura: Lungs are clear. No pleural effusion or pneumothorax.

Upper Abdomen: No acute abnormality.

Musculoskeletal: No chest wall abnormality. No acute or significant
osseous findings.

Review of the MIP images confirms the above findings.
IMPRESSION: No CT evidence for acute pulmonary embolus or aortic dissection. No
acute abnormality.

## 2018-05-14 DIAGNOSIS — Z1389 Encounter for screening for other disorder: Secondary | ICD-10-CM | POA: Diagnosis not present

## 2018-05-14 DIAGNOSIS — E6609 Other obesity due to excess calories: Secondary | ICD-10-CM | POA: Diagnosis not present

## 2018-05-14 DIAGNOSIS — E119 Type 2 diabetes mellitus without complications: Secondary | ICD-10-CM | POA: Diagnosis not present

## 2018-05-14 DIAGNOSIS — E782 Mixed hyperlipidemia: Secondary | ICD-10-CM | POA: Diagnosis not present

## 2018-05-14 DIAGNOSIS — Z6833 Body mass index (BMI) 33.0-33.9, adult: Secondary | ICD-10-CM | POA: Diagnosis not present

## 2018-06-04 DIAGNOSIS — Z6833 Body mass index (BMI) 33.0-33.9, adult: Secondary | ICD-10-CM | POA: Diagnosis not present

## 2018-06-04 DIAGNOSIS — E6609 Other obesity due to excess calories: Secondary | ICD-10-CM | POA: Diagnosis not present

## 2018-06-04 DIAGNOSIS — Z1389 Encounter for screening for other disorder: Secondary | ICD-10-CM | POA: Diagnosis not present

## 2018-06-04 DIAGNOSIS — J069 Acute upper respiratory infection, unspecified: Secondary | ICD-10-CM | POA: Diagnosis not present

## 2019-04-20 DIAGNOSIS — E782 Mixed hyperlipidemia: Secondary | ICD-10-CM | POA: Diagnosis not present

## 2019-04-26 DIAGNOSIS — E6609 Other obesity due to excess calories: Secondary | ICD-10-CM | POA: Diagnosis not present

## 2019-04-26 DIAGNOSIS — Z6833 Body mass index (BMI) 33.0-33.9, adult: Secondary | ICD-10-CM | POA: Diagnosis not present

## 2019-04-26 DIAGNOSIS — E782 Mixed hyperlipidemia: Secondary | ICD-10-CM | POA: Diagnosis not present

## 2019-04-26 DIAGNOSIS — E119 Type 2 diabetes mellitus without complications: Secondary | ICD-10-CM | POA: Diagnosis not present

## 2019-04-26 DIAGNOSIS — Z0000 Encounter for general adult medical examination without abnormal findings: Secondary | ICD-10-CM | POA: Diagnosis not present

## 2019-05-13 DIAGNOSIS — E559 Vitamin D deficiency, unspecified: Secondary | ICD-10-CM | POA: Diagnosis not present

## 2019-05-13 DIAGNOSIS — E782 Mixed hyperlipidemia: Secondary | ICD-10-CM | POA: Diagnosis not present

## 2019-05-13 DIAGNOSIS — Z0000 Encounter for general adult medical examination without abnormal findings: Secondary | ICD-10-CM | POA: Diagnosis not present

## 2019-05-13 DIAGNOSIS — E119 Type 2 diabetes mellitus without complications: Secondary | ICD-10-CM | POA: Diagnosis not present

## 2019-05-13 DIAGNOSIS — R7309 Other abnormal glucose: Secondary | ICD-10-CM | POA: Diagnosis not present

## 2020-01-20 DIAGNOSIS — G4733 Obstructive sleep apnea (adult) (pediatric): Secondary | ICD-10-CM | POA: Diagnosis not present

## 2020-01-20 DIAGNOSIS — E119 Type 2 diabetes mellitus without complications: Secondary | ICD-10-CM | POA: Diagnosis not present

## 2020-01-20 DIAGNOSIS — Z6832 Body mass index (BMI) 32.0-32.9, adult: Secondary | ICD-10-CM | POA: Diagnosis not present

## 2020-01-20 DIAGNOSIS — E6609 Other obesity due to excess calories: Secondary | ICD-10-CM | POA: Diagnosis not present

## 2020-01-20 DIAGNOSIS — E7849 Other hyperlipidemia: Secondary | ICD-10-CM | POA: Diagnosis not present

## 2020-02-01 ENCOUNTER — Encounter: Payer: Self-pay | Admitting: *Deleted

## 2020-02-28 DIAGNOSIS — G4733 Obstructive sleep apnea (adult) (pediatric): Secondary | ICD-10-CM | POA: Diagnosis not present

## 2020-03-29 ENCOUNTER — Ambulatory Visit: Payer: BLUE CROSS/BLUE SHIELD

## 2020-05-03 ENCOUNTER — Encounter: Payer: Self-pay | Admitting: Internal Medicine

## 2020-06-05 DIAGNOSIS — Z1211 Encounter for screening for malignant neoplasm of colon: Secondary | ICD-10-CM | POA: Diagnosis not present

## 2020-06-05 DIAGNOSIS — Z1212 Encounter for screening for malignant neoplasm of rectum: Secondary | ICD-10-CM | POA: Diagnosis not present

## 2020-06-14 LAB — COLOGUARD: COLOGUARD: NEGATIVE

## 2020-06-29 DIAGNOSIS — Z6831 Body mass index (BMI) 31.0-31.9, adult: Secondary | ICD-10-CM | POA: Diagnosis not present

## 2020-06-29 DIAGNOSIS — E7849 Other hyperlipidemia: Secondary | ICD-10-CM | POA: Diagnosis not present

## 2020-06-29 DIAGNOSIS — Z1331 Encounter for screening for depression: Secondary | ICD-10-CM | POA: Diagnosis not present

## 2020-06-29 DIAGNOSIS — Z0000 Encounter for general adult medical examination without abnormal findings: Secondary | ICD-10-CM | POA: Diagnosis not present

## 2020-06-29 DIAGNOSIS — E559 Vitamin D deficiency, unspecified: Secondary | ICD-10-CM | POA: Diagnosis not present

## 2020-06-29 DIAGNOSIS — G4733 Obstructive sleep apnea (adult) (pediatric): Secondary | ICD-10-CM | POA: Diagnosis not present

## 2020-06-29 DIAGNOSIS — E119 Type 2 diabetes mellitus without complications: Secondary | ICD-10-CM | POA: Diagnosis not present

## 2020-06-29 DIAGNOSIS — Z1389 Encounter for screening for other disorder: Secondary | ICD-10-CM | POA: Diagnosis not present

## 2020-06-30 DIAGNOSIS — E119 Type 2 diabetes mellitus without complications: Secondary | ICD-10-CM | POA: Diagnosis not present

## 2020-07-30 DIAGNOSIS — G4733 Obstructive sleep apnea (adult) (pediatric): Secondary | ICD-10-CM | POA: Diagnosis not present

## 2020-08-27 DIAGNOSIS — G4733 Obstructive sleep apnea (adult) (pediatric): Secondary | ICD-10-CM | POA: Diagnosis not present

## 2020-10-03 DIAGNOSIS — G4733 Obstructive sleep apnea (adult) (pediatric): Secondary | ICD-10-CM | POA: Diagnosis not present

## 2020-11-02 DIAGNOSIS — G4733 Obstructive sleep apnea (adult) (pediatric): Secondary | ICD-10-CM | POA: Diagnosis not present

## 2020-12-03 DIAGNOSIS — G4733 Obstructive sleep apnea (adult) (pediatric): Secondary | ICD-10-CM | POA: Diagnosis not present

## 2021-01-02 DIAGNOSIS — G4733 Obstructive sleep apnea (adult) (pediatric): Secondary | ICD-10-CM | POA: Diagnosis not present

## 2021-01-05 DIAGNOSIS — G4733 Obstructive sleep apnea (adult) (pediatric): Secondary | ICD-10-CM | POA: Diagnosis not present

## 2021-02-02 DIAGNOSIS — G4733 Obstructive sleep apnea (adult) (pediatric): Secondary | ICD-10-CM | POA: Diagnosis not present

## 2021-03-05 DIAGNOSIS — G4733 Obstructive sleep apnea (adult) (pediatric): Secondary | ICD-10-CM | POA: Diagnosis not present

## 2021-04-04 DIAGNOSIS — G4733 Obstructive sleep apnea (adult) (pediatric): Secondary | ICD-10-CM | POA: Diagnosis not present

## 2021-05-05 DIAGNOSIS — G4733 Obstructive sleep apnea (adult) (pediatric): Secondary | ICD-10-CM | POA: Diagnosis not present

## 2021-05-31 DIAGNOSIS — J209 Acute bronchitis, unspecified: Secondary | ICD-10-CM | POA: Diagnosis not present

## 2021-05-31 DIAGNOSIS — E119 Type 2 diabetes mellitus without complications: Secondary | ICD-10-CM | POA: Diagnosis not present

## 2021-05-31 DIAGNOSIS — J329 Chronic sinusitis, unspecified: Secondary | ICD-10-CM | POA: Diagnosis not present

## 2021-06-04 DIAGNOSIS — G4733 Obstructive sleep apnea (adult) (pediatric): Secondary | ICD-10-CM | POA: Diagnosis not present

## 2021-06-11 ENCOUNTER — Ambulatory Visit
Admission: EM | Admit: 2021-06-11 | Discharge: 2021-06-11 | Disposition: A | Discharge status: Home / Self Care | Payer: BC Managed Care – PPO

## 2021-06-11 ENCOUNTER — Emergency Department (HOSPITAL_COMMUNITY)
Admission: EM | Admit: 2021-06-11 | Discharge: 2021-06-11 | Disposition: A | Discharge status: Home / Self Care | Payer: BC Managed Care – PPO | Attending: Emergency Medicine | Admitting: Emergency Medicine

## 2021-06-11 ENCOUNTER — Ambulatory Visit (INDEPENDENT_AMBULATORY_CARE_PROVIDER_SITE_OTHER): Payer: BC Managed Care – PPO

## 2021-06-11 ENCOUNTER — Other Ambulatory Visit: Payer: Self-pay

## 2021-06-11 ENCOUNTER — Encounter (HOSPITAL_COMMUNITY): Payer: Self-pay | Admitting: *Deleted

## 2021-06-11 DIAGNOSIS — Z20822 Contact with and (suspected) exposure to covid-19: Secondary | ICD-10-CM | POA: Insufficient documentation

## 2021-06-11 DIAGNOSIS — R051 Acute cough: Secondary | ICD-10-CM | POA: Diagnosis not present

## 2021-06-11 DIAGNOSIS — Z1152 Encounter for screening for COVID-19: Secondary | ICD-10-CM | POA: Diagnosis not present

## 2021-06-11 DIAGNOSIS — B349 Viral infection, unspecified: Secondary | ICD-10-CM | POA: Diagnosis not present

## 2021-06-11 DIAGNOSIS — R509 Fever, unspecified: Secondary | ICD-10-CM

## 2021-06-11 DIAGNOSIS — R0981 Nasal congestion: Secondary | ICD-10-CM

## 2021-06-11 DIAGNOSIS — R059 Cough, unspecified: Secondary | ICD-10-CM

## 2021-06-11 LAB — BASIC METABOLIC PANEL
Anion gap: 9 (ref 5–15)
BUN: 18 mg/dL (ref 8–23)
CO2: 27 mmol/L (ref 22–32)
Calcium: 9.3 mg/dL (ref 8.9–10.3)
Chloride: 100 mmol/L (ref 98–111)
Creatinine, Ser: 1.38 mg/dL — ABNORMAL HIGH (ref 0.61–1.24)
GFR, Estimated: 58 mL/min — ABNORMAL LOW (ref >60)
Glucose, Bld: 185 mg/dL — ABNORMAL HIGH (ref 70–99)
Potassium: 3.9 mmol/L (ref 3.5–5.1)
Sodium: 136 mmol/L (ref 135–145)

## 2021-06-11 LAB — CBC
HCT: 51.4 % (ref 39.0–52.0)
Hemoglobin: 16.5 g/dL (ref 13.0–17.0)
MCH: 29.5 pg (ref 26.0–34.0)
MCHC: 32.1 g/dL (ref 30.0–36.0)
MCV: 91.9 fL (ref 80.0–100.0)
Platelets: 215 10*3/uL (ref 150–400)
RBC: 5.59 MIL/uL (ref 4.22–5.81)
RDW: 12.3 % (ref 11.5–15.5)
WBC: 14.2 10*3/uL — ABNORMAL HIGH (ref 4.0–10.5)
nRBC: 0 % (ref 0.0–0.2)

## 2021-06-11 LAB — RESP PANEL BY RT-PCR (FLU A&B, COVID) ARPGX2
Influenza A by PCR: NEGATIVE
Influenza B by PCR: NEGATIVE
SARS Coronavirus 2 by RT PCR: NEGATIVE

## 2021-06-11 MED ORDER — ACETAMINOPHEN 325 MG PO TABS
650.0000 mg | ORAL_TABLET | Freq: Once | ORAL | Status: AC
  Administered 2021-06-11: 650 mg via ORAL

## 2021-06-11 MED ORDER — IBUPROFEN 800 MG PO TABS: 800.0000 mg | ORAL_TABLET | Freq: Three times a day (TID) | ORAL | 0 refills | Status: AC

## 2021-06-11 MED ORDER — IBUPROFEN 800 MG PO TABS
800.0000 mg | ORAL_TABLET | Freq: Once | ORAL | Status: AC
  Administered 2021-06-11: 800 mg via ORAL

## 2021-06-11 NOTE — ED Triage Notes (Signed)
Referred from urgent care for blood work due to fever

## 2021-06-11 NOTE — ED Triage Notes (Addendum)
Pt reports nasal congestion, low grade fever and cough x 2 weeks. Cough is worse at night.   Pt finished methylprednisolone and Augmentin on 06/09/2021.

## 2021-06-11 NOTE — ED Provider Notes (Signed)
[facility] EMERGENCY DEPARTMENT Provider Note  CSN: [id number] Arrival date & time: 06/11/21 1327  History Chief Complaint  Patient presents with   Fever    Joshua Kirby is a 61 y.o. male without significant PMH reports he was feeling sick with flu-like symptoms on 12/20, went to PCP 12/22, started on steroids and Augmentin with good improvement (he finished both yesterday). He reports he was doing well for a few days but began having fever and cough again 2 days ago. He went to UC where he was noted to be febrile. CXR was clear but he was sent to the ED for further evaluation. His fever broke prior to arrival here and he is feeling well at the time of my evaluation.    Home Medications Prior to Admission medications   Medication Sig Start Date End Date Taking? Authorizing Provider  metFORMIN (GLUCOPHAGE) 500 MG tablet Take 500 mg by mouth every morning. 03/26/21   [provider]  simvastatin (ZOCOR) 20 MG tablet Take 20 mg by mouth every evening.     [provider]     Allergies    Patient has no known allergies.   Review of Systems   Review of Systems Please see HPI for pertinent positives and negatives  Physical Exam BP 133/86 (BP Location: Right Arm)    Pulse 94    Temp 98 F (36.7 C) (Oral)    Resp 16    SpO2 98%   Physical Exam Vitals and nursing note reviewed.  Constitutional:      Appearance: Normal appearance.  HENT:     Head: Normocephalic and atraumatic.     Nose: Nose normal.     Mouth/Throat:     Mouth: Mucous membranes are moist.  Eyes:     Extraocular Movements: Extraocular movements intact.     Conjunctiva/sclera: Conjunctivae normal.  Cardiovascular:     Rate and Rhythm: Normal rate.  Pulmonary:     Effort: Pulmonary effort is normal.     Breath sounds: Normal breath sounds.  Abdominal:     General: Abdomen is flat.     Palpations: Abdomen is soft.     Tenderness: There is no abdominal tenderness.  Musculoskeletal:         General: No swelling. Normal range of motion.     Cervical back: Neck supple.  Skin:    General: Skin is warm and dry.  Neurological:     General: No focal deficit present.     Mental Status: He is alert.  Psychiatric:        Mood and Affect: Mood normal.    ED Results / Procedures / Treatments   EKG None  Procedures Procedures  Medications Ordered in the ED Medications - No data to display  Initial Impression and Plan  Well appearing patient with recurrence of fever and URI symptoms after finishing steroids and Abx. Outpatient CXR was clear. Not febrile or hypoxic on arrival, no other signs of sepsis. Labs done in triage showed a mild leukocytosis consistent with recent steroid use, otherwise normal. Will check a Covid/Flu swab and reassess.   ED Course   Clinical Course as of 06/11/21 1839  Mon Jun 11, 2021  1837 Covid and flu are neg. Patient with a nonspecific viral illness with fever. Looks well now, no indication for repeating Abx. Advised antipyretics, Rx for motrin 800mg  if fever returns. Drink fluids, OTC symptomatic care and RTED or PCP for any worsening.  [CS]  Clinical Course User Index [CS] Pollyann Savoy, MD     MDM Rules/Calculators/A&P Medical Decision Making Problems Addressed: Viral illness: acute illness or injury  Amount and/or Complexity of Data Reviewed External Data Reviewed: labs and notes.    Details: UC clinic notes Labs: ordered.  Risk OTC drugs. Prescription drug management.    Final Clinical Impression(s) / ED Diagnoses Final diagnoses:  Viral illness    Rx / DC Orders ED Discharge Orders     None        Pollyann Savoy, MD 06/11/21 1842

## 2021-06-11 NOTE — ED Provider Notes (Signed)
[facility]   MRN: [id number] DOB: 11-May-1961  Subjective:   Joshua Kirby is a 61 y.o. male presenting for 2-week history of persistent fevers, coughing, sinus congestion.  Patient had a video visit on 05/31/2021 and underwent a Medrol Dosepak, Augmentin.  He finished the Augmentin a day ago.  Unfortunately, he still feels the same and was advised to come to the hospital if these medications did not work.  Denies any headache, confusion, weakness, sinus pain, facial pain, throat pain, chest pain, shortness of breath, wheezing, nausea, vomiting, abdominal pain.  He has a history of sleep apnea but no asthma or respiratory disorders.  He is not a smoker.  He is a prediabetic and has high cholesterol.  He did 3 total at home COVID test.  He states that the initial 1 had a faint positive line but the subsequent ones were all negative.  No current facility-administered medications for this encounter.  Current Outpatient Medications:    metFORMIN (GLUCOPHAGE) 500 MG tablet, Take 500 mg by mouth every morning., Disp: , Rfl:    simvastatin (ZOCOR) 20 MG tablet, Take 20 mg by mouth every evening. , Disp: , Rfl:    No Known Allergies  Past Medical History:  Diagnosis Date   High cholesterol      Past Surgical History:  Procedure Laterality Date   BREAST LUMPECTOMY Right 2003   Benign    Family History  Problem Relation Age of Onset   Kidney disease Mother    Diabetes Brother     Social History   Tobacco Use   Smoking status: Never  Substance Use Topics   Alcohol use: No    Alcohol/week: 0.0 standard drinks   Drug use: Never    ROS   Objective:   Vitals: BP (!) 132/94 (BP Location: Right Arm)    Pulse (!) 120    Temp (!) 101.7 F (38.7 C) (Oral)    Resp 18    SpO2 92%   Temp recheck was 102.70F. Pulse was 112 on recheck. Pulse oximetry was 96%.  Physical Exam Constitutional:      General: He is not in acute distress.    Appearance: Normal  appearance. He is well-developed. He is not ill-appearing, toxic-appearing or diaphoretic.  HENT:     Head: Normocephalic and atraumatic.     Right Ear: External ear normal.     Left Ear: External ear normal.     Nose: Congestion present.     Mouth/Throat:     Mouth: Mucous membranes are moist.  Eyes:     General: No scleral icterus.    Extraocular Movements: Extraocular movements intact.     Pupils: Pupils are equal, round, and reactive to light.  Cardiovascular:     Rate and Rhythm: Normal rate and regular rhythm.     Heart sounds: Normal heart sounds. No murmur heard.   No friction rub. No gallop.  Pulmonary:     Effort: Pulmonary effort is normal. No respiratory distress.     Breath sounds: Normal breath sounds. No stridor. No wheezing, rhonchi or rales.  Neurological:     Mental Status: He is alert and oriented to person, place, and time.  Psychiatric:        Mood and Affect: Mood normal.        Behavior: Behavior normal.        Thought Content: Thought content normal.        Judgment: Judgment normal.   DG Chest 2  View  Result Date: 06/11/2021 CLINICAL DATA:  Persistent cough. EXAM: CHEST - 2 VIEW COMPARISON:  Chest x-ray 11/11/2014 FINDINGS: The cardiac silhouette, mediastinal and hilar contours are normal. The lungs are clear. No pleural effusions. No pulmonary lesions. The bony thorax is intact. IMPRESSION: No acute cardiopulmonary findings. Electronically Signed   By: Rudie Meyer M.D.   On: 06/11/2021 12:13    Assessment and Plan :   PDMP not reviewed this encounter.  1. Fever, unspecified   2. Encounter for screening for COVID-19   3. Acute cough   4. Sinus congestion     Patient has persistent fevers, coughing that did not respond to Augmentin and steroids.  COVID and flu tests are pending. Case discussed with Dr. Delton See.  Advised that he present to the hospital for further testing as he has a persistent fever despite getting Tylenol and ibuprofen here in our  clinic.  He also has a negative x-ray.  Patient would potentially benefit from having more testing such as a chest CT scan.  His wife is also in favor of him going to the hospital.  He contracts for safety and will go there now.    Wallis Bamberg, [place] 06/11/21 1242

## 2021-06-13 LAB — COVID-19, FLU A+B NAA
Influenza A, NAA: NOT DETECTED
Influenza B, NAA: NOT DETECTED
SARS-CoV-2, NAA: NOT DETECTED

## 2021-06-14 DIAGNOSIS — Z0001 Encounter for general adult medical examination with abnormal findings: Secondary | ICD-10-CM | POA: Diagnosis not present

## 2021-06-14 DIAGNOSIS — R634 Abnormal weight loss: Secondary | ICD-10-CM | POA: Diagnosis not present

## 2021-06-14 DIAGNOSIS — J329 Chronic sinusitis, unspecified: Secondary | ICD-10-CM | POA: Diagnosis not present

## 2021-06-14 DIAGNOSIS — R509 Fever, unspecified: Secondary | ICD-10-CM | POA: Diagnosis not present

## 2021-06-14 DIAGNOSIS — Z6829 Body mass index (BMI) 29.0-29.9, adult: Secondary | ICD-10-CM | POA: Diagnosis not present

## 2021-06-14 DIAGNOSIS — R7303 Prediabetes: Secondary | ICD-10-CM | POA: Diagnosis not present

## 2021-06-14 DIAGNOSIS — E669 Obesity, unspecified: Secondary | ICD-10-CM | POA: Diagnosis not present

## 2021-07-05 DIAGNOSIS — G4733 Obstructive sleep apnea (adult) (pediatric): Secondary | ICD-10-CM | POA: Diagnosis not present

## 2022-04-24 DIAGNOSIS — E119 Type 2 diabetes mellitus without complications: Secondary | ICD-10-CM | POA: Diagnosis not present

## 2022-04-24 DIAGNOSIS — Z6830 Body mass index (BMI) 30.0-30.9, adult: Secondary | ICD-10-CM | POA: Diagnosis not present

## 2022-04-24 DIAGNOSIS — Z125 Encounter for screening for malignant neoplasm of prostate: Secondary | ICD-10-CM | POA: Diagnosis not present

## 2022-04-24 DIAGNOSIS — E782 Mixed hyperlipidemia: Secondary | ICD-10-CM | POA: Diagnosis not present

## 2022-04-24 DIAGNOSIS — Z9229 Personal history of other drug therapy: Secondary | ICD-10-CM | POA: Diagnosis not present

## 2022-04-24 DIAGNOSIS — Z1389 Encounter for screening for other disorder: Secondary | ICD-10-CM | POA: Diagnosis not present

## 2022-04-24 DIAGNOSIS — E559 Vitamin D deficiency, unspecified: Secondary | ICD-10-CM | POA: Diagnosis not present

## 2022-04-24 DIAGNOSIS — Z0001 Encounter for general adult medical examination with abnormal findings: Secondary | ICD-10-CM | POA: Diagnosis not present

## 2022-04-24 DIAGNOSIS — Z1331 Encounter for screening for depression: Secondary | ICD-10-CM | POA: Diagnosis not present

## 2022-04-24 DIAGNOSIS — E6609 Other obesity due to excess calories: Secondary | ICD-10-CM | POA: Diagnosis not present

## 2022-06-17 DIAGNOSIS — Z6831 Body mass index (BMI) 31.0-31.9, adult: Secondary | ICD-10-CM | POA: Diagnosis not present

## 2022-06-17 DIAGNOSIS — U071 COVID-19: Secondary | ICD-10-CM | POA: Diagnosis not present

## 2022-06-17 DIAGNOSIS — R0981 Nasal congestion: Secondary | ICD-10-CM | POA: Diagnosis not present

## 2022-06-17 DIAGNOSIS — I10 Essential (primary) hypertension: Secondary | ICD-10-CM | POA: Diagnosis not present

## 2022-06-17 DIAGNOSIS — E669 Obesity, unspecified: Secondary | ICD-10-CM | POA: Diagnosis not present

## 2023-04-11 DIAGNOSIS — Z6830 Body mass index (BMI) 30.0-30.9, adult: Secondary | ICD-10-CM | POA: Diagnosis not present

## 2023-04-11 DIAGNOSIS — E6609 Other obesity due to excess calories: Secondary | ICD-10-CM | POA: Diagnosis not present

## 2023-04-11 DIAGNOSIS — E119 Type 2 diabetes mellitus without complications: Secondary | ICD-10-CM | POA: Diagnosis not present

## 2023-04-11 DIAGNOSIS — Z9229 Personal history of other drug therapy: Secondary | ICD-10-CM | POA: Diagnosis not present

## 2023-04-11 DIAGNOSIS — E559 Vitamin D deficiency, unspecified: Secondary | ICD-10-CM | POA: Diagnosis not present

## 2023-04-11 DIAGNOSIS — E782 Mixed hyperlipidemia: Secondary | ICD-10-CM | POA: Diagnosis not present

## 2023-04-11 DIAGNOSIS — D518 Other vitamin B12 deficiency anemias: Secondary | ICD-10-CM | POA: Diagnosis not present

## 2023-06-09 DIAGNOSIS — Z1212 Encounter for screening for malignant neoplasm of rectum: Secondary | ICD-10-CM | POA: Diagnosis not present

## 2023-06-09 DIAGNOSIS — Z1211 Encounter for screening for malignant neoplasm of colon: Secondary | ICD-10-CM | POA: Diagnosis not present

## 2023-06-17 LAB — COLOGUARD: COLOGUARD: NEGATIVE

## 2023-07-12 DIAGNOSIS — E669 Obesity, unspecified: Secondary | ICD-10-CM | POA: Diagnosis not present

## 2023-07-12 DIAGNOSIS — R059 Cough, unspecified: Secondary | ICD-10-CM | POA: Diagnosis not present

## 2023-07-12 DIAGNOSIS — R030 Elevated blood-pressure reading, without diagnosis of hypertension: Secondary | ICD-10-CM | POA: Diagnosis not present

## 2023-07-12 DIAGNOSIS — Z6831 Body mass index (BMI) 31.0-31.9, adult: Secondary | ICD-10-CM | POA: Diagnosis not present

## 2024-05-24 ENCOUNTER — Ambulatory Visit

## 2024-05-24 DIAGNOSIS — Z113 Encounter for screening for infections with a predominantly sexual mode of transmission: Secondary | ICD-10-CM

## 2024-05-24 DIAGNOSIS — E559 Vitamin D deficiency, unspecified: Secondary | ICD-10-CM | POA: Insufficient documentation

## 2024-05-24 DIAGNOSIS — Z7984 Long term (current) use of oral hypoglycemic drugs: Secondary | ICD-10-CM | POA: Diagnosis not present

## 2024-05-24 DIAGNOSIS — Z23 Encounter for immunization: Secondary | ICD-10-CM

## 2024-05-24 DIAGNOSIS — Z7689 Persons encountering health services in other specified circumstances: Secondary | ICD-10-CM

## 2024-05-24 DIAGNOSIS — E119 Type 2 diabetes mellitus without complications: Secondary | ICD-10-CM | POA: Diagnosis not present

## 2024-05-24 DIAGNOSIS — E7849 Other hyperlipidemia: Secondary | ICD-10-CM | POA: Insufficient documentation

## 2024-05-24 MED ORDER — METFORMIN HCL 500 MG PO TABS: 500.0000 mg | ORAL_TABLET | Freq: Every morning | ORAL | 3 refills | Status: AC

## 2024-05-24 MED ORDER — SIMVASTATIN 20 MG PO TABS: 20.0000 mg | ORAL_TABLET | Freq: Every evening | ORAL | 3 refills | Status: AC

## 2024-05-24 NOTE — Progress Notes (Signed)
 New Patient Office Visit  Subjective    Patient ID: Joshua Kirby, male    DOB: April 13, 1961  Age: 63 y.o. MRN: [id number]  HPI Joshua Kirby presents to establish care. Currently sees no specialists. Is doing well with managing his diabetes, and is taking his metformin  as prescribed. Takes his blood sugar occasionally and says that it is always less than 180. Diet is good, and tries to avoid foods high in carbohydrates. Says that occasionally he will have a treat, but tries to stay away from sweets. 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  Has a past medical history of a vitamin D deficiency, and is taking a vitamin D supplement that was prescribed by his prior PCP once a week. Was having muscle cramps previously when he was deficient in vitamin D. Denies bone pain, muscle cramps or fatigue. Reports that he has not had blood work in almost 8 months. His last physical was November of 2024.   Past Medical History:  Diagnosis Date   Diabetes mellitus without complication (HCC)    High cholesterol     Past Surgical History:  Procedure Laterality Date   BREAST LUMPECTOMY Right 2003   Benign    Family History  Problem Relation Age of Onset   Kidney disease Mother    Diabetes Brother     Social History   Socioeconomic History   Marital status: Married    Spouse name: Not on file   Number of children: 2   Years of education: HS   Highest education level: 12th grade  Occupational History    Comment: [employer]  Tobacco Use   Smoking status: Never   Smokeless tobacco: Not on file  Substance and Sexual Activity   Alcohol use: No    Alcohol/week: 0.0 standard drinks of alcohol   Drug use: Never   Sexual activity: Yes  Other Topics Concern   Not on file  Social History Narrative   Caffeine 1 cup daily am, 2 drinks daily.   Social Drivers of Health   Tobacco Use: Unknown (05/24/2024)   Patient History    Smoking Tobacco Use: Never     Smokeless Tobacco Use: Unknown    Passive Exposure: Not on file  Financial Resource Strain: Low Risk (05/23/2024)   Overall Financial Resource Strain (CARDIA)    Difficulty of Paying Living Expenses: Not hard at all  Food Insecurity: No Food Insecurity (05/23/2024)   Epic    Worried About Programme Researcher, Broadcasting/film/video in the Last Year: Never true    Ran Out of Food in the Last Year: Never true  Transportation Needs: No Transportation Needs (05/23/2024)   Epic    Lack of Transportation (Medical): No    Lack of Transportation (Non-Medical): No  Physical Activity: Not on file  Stress: No Stress Concern Present (05/23/2024)   [employer] of Occupational Health - Occupational Stress Questionnaire    Feeling of Stress: Not at all  Social Connections: Socially Integrated (05/23/2024)   Social Connection and Isolation Panel    Frequency of Communication with Friends and Family: Twice a week    Frequency of Social Gatherings with Friends and Family: Once a week    Attends Religious Services: More than 4 times per year    Active Member of [employer] or Organizations: Yes    Attends Engineer, Structural: More than 4 times per year    Marital Status: Married  Catering Manager Violence: Not  on file  Depression (PHQ2-9): Low Risk (05/24/2024)   Depression (PHQ2-9)    PHQ-2 Score: 0  Alcohol Screen: Not on file  Housing: Low Risk (05/23/2024)   Epic    Unable to Pay for Housing in the Last Year: No    Number of Times Moved in the Last Year: 0    Homeless in the Last Year: No  Utilities: Not on file  Health Literacy: Not on file    Review of Systems  Constitutional:  Negative for chills, fatigue and fever.  Eyes:  Negative for visual disturbance.  Respiratory:  Negative for cough, chest tightness, shortness of breath and wheezing.   Cardiovascular:  Negative for chest pain and palpitations.  Musculoskeletal:  Negative for myalgias.    Objective    BP 138/77 (BP Location: Left Arm,  Patient Position: Sitting)   Pulse (!) 58   Temp (!) 97.5 F (36.4 C)   Ht 5' 7 (1.702 m)   Wt 201 lb 6 oz (91.3 kg)   SpO2 97%   BMI 31.54 kg/m   Physical Exam Vitals and nursing note reviewed.  Constitutional:      General: He is not in acute distress.    Appearance: Normal appearance. He is normal weight. He is not ill-appearing.  Cardiovascular:     Rate and Rhythm: Normal rate and regular rhythm.     Heart sounds: Normal heart sounds, S1 normal and S2 normal. No murmur heard. Pulmonary:     Effort: Pulmonary effort is normal. No respiratory distress.     Breath sounds: Normal breath sounds. No wheezing.  Musculoskeletal:     Cervical back: No torticollis.     Thoracic back: No scoliosis.     Lumbar back: No scoliosis.  Neurological:     Mental Status: He is alert.     Coordination: Romberg sign negative.  Psychiatric:        Mood and Affect: Mood normal.        Behavior: Behavior normal.        Thought Content: Thought content normal.        Judgment: Judgment normal.    Diabetic Foot Exam - Simple   Simple Foot Form Visual Inspection No deformities, no ulcerations, no other skin breakdown bilaterally: Yes Sensation Testing Intact to touch and monofilament testing bilaterally: Yes Pulse Check Posterior Tibialis and Dorsalis pulse intact bilaterally: Yes Comments    Assessment & Plan:  1. Encounter to establish care -Advised patient to schedule a physical.   2. Other hyperlipidemia -Advised patient to continue limiting high fat foods or highly processed foods. Patient to continue taking statin medication. Will report if he experiences any side effects from the medication.  -Will get updated lipid panel to ensure that cholesterol is within normal limits due to his increased ASCVD risk related to diabetes, and evaluate for any need of medication dose change.  - simvastatin  (ZOCOR ) 20 MG tablet; Take 1 tablet (20 mg total) by mouth every evening.  Dispense: 30  tablet; Refill: 3 - Lipid panel  3. Screening for STD (sexually transmitted disease) - HIV Antibody (routine testing w rflx) - Hepatitis C antibody  4. Immunization due - Flu vaccine HIGH DOSE PF(Fluzone Trivalent) - Pneumococcal conjugate vaccine 20-valent  5. Type 2 diabetes mellitus without complication, without long-term current use of insulin (HCC) -Advised patient to continue minimizing foods high in sugars and carbohydrates.  Encourage patient to try to increase walking as tolerated. -Advised patient about monitoring blood sugar  occasionally, and to notify us  if it is consistently 180.  -Would like to get updated labs on this patient to see how well his diabetes is managed.  -Recommended patient to get ophthalmology to send records to office, and advised that he gets a yearly eye exam.  - metFORMIN  (GLUCOPHAGE ) 500 MG tablet; Take 1 tablet (500 mg total) by mouth every morning.  Dispense: 30 tablet; Refill: 3 - CMP14+EGFR - Hemoglobin A1c - Microalbumin / creatinine urine ratio  6. Vitamin D deficiency -Due to patient already being diagnosed with a vitamin D deficiency by a prior PCP, and continuing to take the supplement, advised patient that I do not warrant further blood work to check vitamin D levels at this time. The patient is having no symptoms that he was previously having. He will continue taking his supplement as prescribed by previous PCP.  -If he has any symptoms he will let us  know.   Return for physical.   Damien KATHEE Pringle, FNP

## 2024-06-24 ENCOUNTER — Ambulatory Visit
Admission: EM | Admit: 2024-06-24 | Discharge: 2024-06-24 | Disposition: A | Discharge status: Home / Self Care | Attending: Family Medicine | Admitting: Family Medicine

## 2024-06-24 DIAGNOSIS — Z20828 Contact with and (suspected) exposure to other viral communicable diseases: Secondary | ICD-10-CM

## 2024-06-24 DIAGNOSIS — J069 Acute upper respiratory infection, unspecified: Secondary | ICD-10-CM

## 2024-06-24 LAB — POCT INFLUENZA A/B
Influenza A, POC: NEGATIVE
Influenza B, POC: NEGATIVE

## 2024-06-24 MED ORDER — PROMETHAZINE-DM 6.25-15 MG/5ML PO SYRP: 5.0000 mL | ORAL_SOLUTION | Freq: Four times a day (QID) | ORAL | 0 refills | Status: AC | PRN

## 2024-06-24 MED ORDER — AZELASTINE HCL 0.1 % NA SOLN: 1.0000 | Freq: Two times a day (BID) | NASAL | 0 refills | Status: AC

## 2024-06-24 MED ORDER — OSELTAMIVIR PHOSPHATE 75 MG PO CAPS: 75.0000 mg | ORAL_CAPSULE | Freq: Two times a day (BID) | ORAL | 0 refills | Status: AC

## 2024-06-24 NOTE — ED Triage Notes (Signed)
 Pt reports he has nasal congestion, body aches, sore throat x 3 days   Took Tylenol 

## 2024-06-24 NOTE — ED Provider Notes (Signed)
 " [facility]    CSN: [id number] Arrival date & time: 06/24/24  1548      History   Chief Complaint No chief complaint on file.   HPI Joshua Kirby is a 64 y.o. male.   Patient resenting today with 3-day history of nasal congestion, body aches, sore throat, fatigue, cough.  Denies fever, chest pain, shortness of breath, vomiting, diarrhea, rashes.  So far trying Tylenol  with minimal relief.  No known history of chronic pulmonary disease.  Multiple sick contacts at work with the flu.    Past Medical History:  Diagnosis Date   Diabetes mellitus without complication (HCC)    High cholesterol     Patient Active Problem List   Diagnosis Date Noted   Type 2 diabetes mellitus without complication, without long-term current use of insulin (HCC) 05/24/2024   Other hyperlipidemia 05/24/2024   Vitamin D deficiency 05/24/2024   Episodic circadian rhythm sleep disorder, shift work type 09/07/2014   OSA on CPAP 09/07/2014   Retrognathia 09/07/2014   S/P UPPP (uvulopalatopharyngoplasty) 09/07/2014    Past Surgical History:  Procedure Laterality Date   BREAST LUMPECTOMY Right 2003   Benign       Home Medications    Prior to Admission medications  Medication Sig Start Date End Date Taking? Authorizing Provider  azelastine  (ASTELIN ) 0.1 % nasal spray Place 1 spray into both nostrils 2 (two) times daily. Use in each nostril as directed 06/24/24  Yes Stuart Vernell Norris, PA-C  oseltamivir  (TAMIFLU ) 75 MG capsule Take 1 capsule (75 mg total) by mouth every 12 (twelve) hours. 06/24/24  Yes Stuart Vernell Norris, PA-C  promethazine -dextromethorphan (PROMETHAZINE -DM) 6.25-15 MG/5ML syrup Take 5 mLs by mouth 4 (four) times daily as needed. 06/24/24  Yes Stuart Vernell Norris, PA-C  ibuprofen  (ADVIL ) 800 MG tablet Take 1 tablet (800 mg total) by mouth 3 (three) times daily. 06/11/21   Roselyn Carlin NOVAK, MD  metFORMIN  (GLUCOPHAGE ) 500 MG tablet Take 1 tablet (500 mg total)  by mouth every morning. 05/24/24   Gerard Damien NOVAK, FNP  simvastatin  (ZOCOR ) 20 MG tablet Take 1 tablet (20 mg total) by mouth every evening. 05/24/24   Gerard Damien NOVAK, FNP    Family History Family History  Problem Relation Age of Onset   Kidney disease Mother    Diabetes Brother     Social History Social History[1]   Allergies   Patient has no known allergies.   Review of Systems Review of Systems Per HPI  Physical Exam Triage Vital Signs ED Triage Vitals  Encounter Vitals Group     BP 06/24/24 1604 (!) 150/90     Girls Systolic BP Percentile --      Girls Diastolic BP Percentile --      Boys Systolic BP Percentile --      Boys Diastolic BP Percentile --      Pulse Rate 06/24/24 1604 83     Resp 06/24/24 1604 18     Temp 06/24/24 1604 99.1 F (37.3 C)     Temp Source 06/24/24 1604 Oral     SpO2 06/24/24 1604 94 %     Weight --      Height --      Head Circumference --      Peak Flow --      Pain Score 06/24/24 1603 5     Pain Loc --      Pain Education --      Exclude from Growth Chart --  No data found.  Updated Vital Signs BP (!) 150/90 (BP Location: Right Arm)   Pulse 83   Temp 99.1 F (37.3 C) (Oral)   Resp 18   SpO2 94%   Visual Acuity Right Eye Distance:   Left Eye Distance:   Bilateral Distance:    Right Eye Near:   Left Eye Near:    Bilateral Near:     Physical Exam Vitals and nursing note reviewed.  Constitutional:      Appearance: He is well-developed.  HENT:     Head: Atraumatic.     Right Ear: External ear normal.     Left Ear: External ear normal.     Nose: Rhinorrhea present.     Mouth/Throat:     Pharynx: Posterior oropharyngeal erythema present. No oropharyngeal exudate.  Eyes:     Conjunctiva/sclera: Conjunctivae normal.     Pupils: Pupils are equal, round, and reactive to light.  Cardiovascular:     Rate and Rhythm: Normal rate and regular rhythm.  Pulmonary:     Effort: Pulmonary effort is normal. No  respiratory distress.     Breath sounds: No wheezing or rales.  Musculoskeletal:        General: Normal range of motion.     Cervical back: Normal range of motion and neck supple.  Lymphadenopathy:     Cervical: No cervical adenopathy.  Skin:    General: Skin is warm and dry.  Neurological:     Mental Status: He is alert and oriented to person, place, and time.  Psychiatric:        Behavior: Behavior normal.    UC Treatments / Results  Labs (all labs ordered are listed, but only abnormal results are displayed) Labs Reviewed  POCT INFLUENZA A/B    EKG   Radiology No results found.  Procedures Procedures (including critical care time)  Medications Ordered in UC Medications - No data to display  Initial Impression / Assessment and Plan / UC Course  I have reviewed the triage vital signs and the nursing notes.  Pertinent labs & imaging results that were available during my care of the patient were reviewed by me and considered in my medical decision making (see chart for details).     Rapid flu negative, vitals and exam reassuring today but given symptoms and close contacts with the flu will treat with Tamiflu , Astelin , Phenergan  DM, supportive over-the-counter medications and home care.  Work note given.  Final Clinical Impressions(s) / UC Diagnoses   Final diagnoses:  Viral URI with cough  Exposure to influenza     Discharge Instructions      In addition to the prescribed medications, you may take Coricidin HBP, plain Mucinex, use over-the-counter pain and fever reducers as needed.  Stay well-hydrated and get plenty of rest.    ED Prescriptions     Medication Sig Dispense Auth. Provider   oseltamivir  (TAMIFLU ) 75 MG capsule Take 1 capsule (75 mg total) by mouth every 12 (twelve) hours. 10 capsule Stuart Vernell Norris, PA-C   azelastine  (ASTELIN ) 0.1 % nasal spray Place 1 spray into both nostrils 2 (two) times daily. Use in each nostril as directed 30 mL  Stuart Vernell Norris, PA-C   promethazine -dextromethorphan (PROMETHAZINE -DM) 6.25-15 MG/5ML syrup Take 5 mLs by mouth 4 (four) times daily as needed. 100 mL Stuart Vernell Norris, [place]      PDMP not reviewed this encounter.    [1]  Social History Tobacco Use   Smoking status: Never  Substance Use Topics   Alcohol use: No    Alcohol/week: 0.0 standard drinks of alcohol   Drug use: Never     Stuart Vernell Norris, PA-C 06/24/24 1727  "

## 2024-06-24 NOTE — Discharge Instructions (Signed)
 In addition to the prescribed medications, you may take Coricidin HBP, plain Mucinex, use over-the-counter pain and fever reducers as needed.  Stay well-hydrated and get plenty of rest.

## 2024-08-05 ENCOUNTER — Ambulatory Visit: Payer: Self-pay | Admitting: Family Medicine
